# Patient Record
Sex: Female | Born: 1990 | Race: White | Hispanic: No | Marital: Single | Smoking: Never smoker
Health system: Southern US, Community
[De-identification: ages and names within clinical notes are randomized; demographics above are authoritative.]

## PROBLEM LIST (undated history)

## (undated) DIAGNOSIS — F329 Major depressive disorder, single episode, unspecified: Secondary | ICD-10-CM

## (undated) DIAGNOSIS — F32A Depression, unspecified: Secondary | ICD-10-CM

## (undated) DIAGNOSIS — G43909 Migraine, unspecified, not intractable, without status migrainosus: Secondary | ICD-10-CM

## (undated) DIAGNOSIS — F419 Anxiety disorder, unspecified: Secondary | ICD-10-CM

## (undated) HISTORY — PX: CHOLECYSTECTOMY: SHX55

---
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
## 2014-08-05 ENCOUNTER — Encounter: Payer: Self-pay | Admitting: Emergency Medicine

## 2014-08-05 ENCOUNTER — Emergency Department (INDEPENDENT_AMBULATORY_CARE_PROVIDER_SITE_OTHER): Payer: BLUE CROSS/BLUE SHIELD

## 2014-08-05 ENCOUNTER — Emergency Department
Admission: EM | Admit: 2014-08-05 | Discharge: 2014-08-05 | Disposition: A | Discharge status: Home / Self Care | Payer: BLUE CROSS/BLUE SHIELD | Source: Home / Self Care | Attending: Family Medicine | Admitting: Family Medicine

## 2014-08-05 DIAGNOSIS — M25572 Pain in left ankle and joints of left foot: Secondary | ICD-10-CM | POA: Diagnosis not present

## 2014-08-05 DIAGNOSIS — M7742 Metatarsalgia, left foot: Secondary | ICD-10-CM | POA: Diagnosis not present

## 2014-08-05 HISTORY — DX: Anxiety disorder, unspecified: F41.9

## 2014-08-05 HISTORY — DX: Major depressive disorder, single episode, unspecified: F32.9

## 2014-08-05 HISTORY — DX: Depression, unspecified: F32.A

## 2014-08-05 MED ORDER — MELOXICAM 15 MG PO TABS: 15.0000 mg | ORAL_TABLET | Freq: Every day | ORAL | Status: DC

## 2014-08-05 NOTE — ED Notes (Signed)
Reports feeling discomfort in arch of left foot last evening which worsened over night; did light activity in park yesterday.

## 2014-08-05 NOTE — ED Provider Notes (Signed)
CSN: [id number]     Arrival date & time 08/05/14  1242 History   First MD Initiated Contact with Patient 08/05/14 1315     Chief Complaint  Patient presents with  . Foot Pain      HPI Comments: Patient developed soreness in the arch of her left foot yesterday evening which became worse during the night.  No injury but she had been walking in a park yesterday.  Patient is a 24 y.o. female presenting with lower extremity pain. The history is provided by the patient.  Foot Pain This is a new problem. The current episode started yesterday. The problem occurs constantly. The problem has not changed since onset.Associated symptoms comments: none. The symptoms are aggravated by walking and standing. Nothing relieves the symptoms. She has tried nothing for the symptoms.    Past Medical History  Diagnosis Date  . Anxiety and depression    Past Surgical History  Procedure Laterality Date  . Cholecystectomy     Family History  Problem Relation Age of Onset  . Rheum arthritis Mother   . Cancer Father    History  Substance Use Topics  . Smoking status: Never Smoker   . Smokeless tobacco: Not on file  . Alcohol Use: Yes   OB History    No data available     Review of Systems  All other systems reviewed and are negative.   Allergies  Review of patient's allergies indicates not on file.  Home Medications   Prior to Admission medications   Medication Sig Start Date End Date Taking? Authorizing Provider  norethindrone-ethinyl estradiol (JUNEL FE,GILDESS FE,LOESTRIN FE) 1-20 MG-MCG tablet Take 1 tablet by mouth daily.   Yes Historical Provider, MD  meloxicam (MOBIC) 15 MG tablet Take 1 tablet (15 mg total) by mouth daily. Take with food each morning 08/05/14   Lattie Haw, MD   BP 100/72 mmHg  Pulse 96  Temp(Src) 98.6 F (37 C) (Oral)  Resp 16  Ht  (1.626 m)  Wt 215 lb (97.523 kg)  BMI 36.89 kg/m2  SpO2 98%  LMP 07/08/2014 Physical Exam  Constitutional: She is  oriented to person, place, and time. She appears well-developed and well-nourished. No distress.  Patient is obese (BMI 36.9)  HENT:  Head: Normocephalic.  Eyes: Pupils are equal, round, and reactive to light.  Musculoskeletal:       Right shoulder: She exhibits tenderness, bony tenderness and pain. She exhibits normal range of motion, no swelling, no crepitus, no deformity, no laceration and normal pulse.       Left foot: There is tenderness and bony tenderness. There is normal range of motion, no swelling, normal capillary refill, no crepitus, no deformity and no laceration.       Feet:  Left foot reveals distinct point tenderness over mid-shaft of second metatarsal.  No swelling or ecchymosis.  Neurological: She is alert and oriented to person, place, and time.  Skin: Skin is warm and dry. No rash noted.  Nursing note and vitals reviewed.   ED Course  Procedures  none  Imaging Review Dg Foot Complete Left  08/05/2014   CLINICAL DATA:  LEFT foot pain. Onset of symptoms last night. Tenderness to palpation over the second metatarsal.  EXAM: LEFT FOOT - COMPLETE 3+ VIEW  COMPARISON:  None.  FINDINGS: The alignment is within normal limits aside from splaying of the first and second toes, which may be normal for this patient. The Lisfranc joint appears normal. Midfoot  bones appear normal. No fracture is identified. Second metatarsal appears within normal limits. No radiopaque foreign body.  IMPRESSION: Negative.   Electronically Signed   By: Andreas NewportGeoffrey  Lamke M.D.   On: 08/05/2014 13:52     MDM   1. Metatarsalgia of left foot    Begin Mobic 15mg  daily.  Recommend wearing well fitting shoes with adequate arch support. Followup with Dr. Rodney Langtonhomas Thekkekand[facility] ([facility]) if not improving about two weeks.                                                                                    Lattie HawStephen A Beese, MD 08/08/14 [phone]

## 2014-08-05 NOTE — Discharge Instructions (Signed)
Recommend wearing well fitting shoes with adequate arch support.

## 2016-09-25 DIAGNOSIS — F330 Major depressive disorder, recurrent, mild: Secondary | ICD-10-CM | POA: Insufficient documentation

## 2017-08-15 ENCOUNTER — Ambulatory Visit (HOSPITAL_COMMUNITY): Payer: BLUE CROSS/BLUE SHIELD | Admitting: Psychiatry

## 2017-12-04 ENCOUNTER — Other Ambulatory Visit: Payer: Self-pay

## 2017-12-04 ENCOUNTER — Encounter (HOSPITAL_COMMUNITY): Payer: Self-pay | Admitting: Psychiatry

## 2017-12-04 ENCOUNTER — Ambulatory Visit (INDEPENDENT_AMBULATORY_CARE_PROVIDER_SITE_OTHER): Payer: PRIVATE HEALTH INSURANCE | Admitting: Psychiatry

## 2017-12-04 VITALS — BP 118/80 | HR 83 | Ht 64.0 in | Wt 208.0 lb

## 2017-12-04 DIAGNOSIS — F411 Generalized anxiety disorder: Secondary | ICD-10-CM | POA: Diagnosis not present

## 2017-12-04 DIAGNOSIS — F330 Major depressive disorder, recurrent, mild: Secondary | ICD-10-CM

## 2017-12-04 MED ORDER — ESCITALOPRAM OXALATE 10 MG PO TABS: 10.0000 mg | ORAL_TABLET | Freq: Every day | ORAL | 0 refills | Status: DC

## 2017-12-04 NOTE — Patient Instructions (Signed)
Refer to therapy  Assign a ME time  Assign a Worry time

## 2017-12-04 NOTE — Progress Notes (Signed)
Psychiatric Initial Adult Assessment   Patient Identification: Wendy Meyers MRN:  [id number] Date of Evaluation:  12/04/2017 Referral Source: Sherlyn Lees, MD Chief Complaint:   Chief Complaint    Establish Care; Anxiety     Visit Diagnosis:    ICD-10-CM   1. 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 GAD (generalized anxiety disorder) F41.1     History of Present Illness: 27 years old currently single white female referred by primary care physician for management of anxiety and depression  Patient is here is excessive about finances about her promotion about losing her friends these anxiety and fear keep her tense.  She has had friends in the past but they have left her.  She has difficulty maintaining sleep she has fear of losing or getting lonely She also has episodes of depression which may last for more than a week including decreased energy decreased sleep withdrawn loneliness feeling of sadness and crying spells that last for a week or so  Does not endorse paranoia or hearing voices psychotic symptoms or manic symptoms  Does not endorse regular alcohol use drinks every other day one wine last  Currently she is living with her mom it has been stressful because she still treats her like a 18 years  Patient has felt neglected by her dad who was favoring her sister.  She also had a difficult time in high school was bullied says that she was pale or she was quiet she was not active in sports she also had a difficult time in [place] undergrad school having dependent on friendship and then it would end and she would feel neglected  Modifying factor: family, house Aggravating factor: bullied when young, feels lonely  Duration: more then 10 years On and off counselling for depression when young    Associated Signs/Symptoms: Depression Symptoms:  depressed mood, fatigue, anxiety, loss of energy/fatigue, disturbed sleep, (Hypo) Manic Symptoms:   Distractibility, Anxiety Symptoms:  Excessive Worry, Psychotic Symptoms:  denies PTSD Symptoms: Had a traumatic exposure:  neglected by dad, bullied when young Hyperarousal:  Difficulty Concentrating Emotional Numbness/Detachment  Past Psychiatric History: depression, anxiety  Previous Psychotropic Medications: Yes   Substance Abuse History in the last 12 months:  Yes.    Consequences of Substance Abuse: Medical Consequences:  fatigue  Past Medical History:  Past Medical History:  Diagnosis Date  . Anxiety and depression     Past Surgical History:  Procedure Laterality Date  . CHOLECYSTECTOMY      Family Psychiatric History: sister : anxiety Mom possible depression  Family History:  Family History  Problem Relation Age of Onset  . Rheum arthritis Mother   . Cancer Father   . Anxiety disorder Sister     Social History:   Social History   Socioeconomic History  . Marital status: Single    Spouse name: Not on file  . Number of children: Not on file  . Years of education: Not on file  . Highest education level: Not on file  Occupational History  . Not on file  Social Needs  . Financial resource strain: Not on file  . Food insecurity:    Worry: Not on file    Inability: Not on file  . Transportation needs:    Medical: Not on file    Non-medical: Not on file  Tobacco Use  . Smoking status: Never Smoker  . Smokeless tobacco: Never Used  Substance and Sexual Activity  . Alcohol use: Yes  Alcohol/week: 1.0 standard drinks    Types: 1 Glasses of wine per week    Comment: 1 every few days  . Drug use: Not on file  . Sexual activity: Not on file  Lifestyle  . Physical activity:    Days per week: Not on file    Minutes per session: Not on file  . Stress: Not on file  Relationships  . Social connections:    Talks on phone: Not on file    Gets together: Not on file    Attends religious service: Not on file    Active member of club or organization: Not  on file    Attends meetings of clubs or organizations: Not on file    Relationship status: Not on file  Other Topics Concern  . Not on file  Social History Narrative  . Not on file    Additional Social History: grew up with parents till age 9.  They divorced after that.  Patient wanted them to divorce because dad was not favoring her and also she did not get along with him. Bullied in high school difficult time  Works as a Comptroller  Allergies:  No Known Allergies  Metabolic Disorder Labs: No results found for: HGBA1C, MPG No results found for: PROLACTIN No results found for: CHOL, TRIG, HDL, CHOLHDL, VLDL, LDLCALC   Current Medications: Current Outpatient Medications  Medication Sig Dispense Refill  . norethindrone-ethinyl estradiol (JUNEL FE,GILDESS FE,LOESTRIN FE) 1-20 MG-MCG tablet Take 1 tablet by mouth daily.    . SPRINTEC 28 0.25-35 MG-MCG tablet Take 1 tablet by mouth daily.  4  . escitalopram (LEXAPRO) 10 MG tablet Take 1 tablet (10 mg total) by mouth daily. 30 tablet 0  . meloxicam (MOBIC) 15 MG tablet Take 1 tablet (15 mg total) by mouth daily. Take with food each morning (Patient not taking: Reported on 12/04/2017) 15 tablet 0   No current facility-administered medications for this visit.     Neurologic: Headache: No Seizure: No Paresthesias:No  Musculoskeletal: Strength & Muscle Tone: within normal limits Gait & Station: normal Patient leans: no lean  Psychiatric Specialty Exam: Review of Systems  Skin: Negative for rash.  Neurological: Negative for tremors.  Psychiatric/Behavioral: Positive for depression. Negative for substance abuse. The patient is nervous/anxious.     Blood pressure 118/80, pulse 83, height 5\' 4"  (1.626 m), weight 208 lb (94.3 kg).Body mass index is 35.7 kg/m.  General Appearance: Casual  Eye Contact:  Fair  Speech:  Normal Rate  Volume:  Decreased  Mood:  Dysphoric  Affect:  Congruent  Thought Process:  Goal Directed   Orientation:  Full (Time, Place, and Person)  Thought Content:  Logical and Rumination  Suicidal Thoughts:  No  Homicidal Thoughts:  No  Memory:  Immediate;   Fair Recent;   Fair  Judgement:  Fair  Insight:  Shallow  Psychomotor Activity:  Decreased  Concentration:  Concentration: Fair and Attention Span: Fair  Recall:  [employer] of Knowledge:Good  Language: Good  Akathisia:  No  Handed:  Right  AIMS (if indicated):    Assets:  Desire for Improvement  ADL's:  Intact  Cognition: WNL  Sleep:  variable    Treatment Plan Summary: Medication management and Plan as follows  MDD, mild recurrent; start lexapro GAD: excessive worries. Refer to therapy for CBT  Start lexapro 5mg  increase if tolerate after one week  Assign a Worry time Assign a ME time and increase physical activitiy. Distraction from  worries  More than 50% time spent in counseling and coordination of care including patient education reviewed side effects and concerns were addressed FU 4 w or earlier if needed   [name], MD 10/31/20199:35 AM

## 2017-12-29 ENCOUNTER — Ambulatory Visit (INDEPENDENT_AMBULATORY_CARE_PROVIDER_SITE_OTHER): Payer: PRIVATE HEALTH INSURANCE | Admitting: Licensed Clinical Social Worker

## 2017-12-29 DIAGNOSIS — F411 Generalized anxiety disorder: Secondary | ICD-10-CM

## 2017-12-29 DIAGNOSIS — F330 Major depressive disorder, recurrent, mild: Secondary | ICD-10-CM | POA: Diagnosis not present

## 2017-12-30 ENCOUNTER — Encounter (HOSPITAL_COMMUNITY): Payer: Self-pay | Admitting: Licensed Clinical Social Worker

## 2017-12-30 NOTE — Progress Notes (Signed)
Comprehensive Clinical Assessment (CCA) Note  12/30/2017 Wendy BannisterJulie Meyers [id number]  Visit Diagnosis:      ICD-10-CM   1. GAD (generalized anxiety disorder) F41.1   2. 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  (See scanned document in Chart Review)  CCA Part Two A  Intake/Chief Complaint:  CCA Intake With Chief Complaint CCA Part Two Date: 12/29/17 CCA Part Two Time: 0840 Chief Complaint/Presenting Problem: Referred for therapy by our psychiatrist for concerns related to anxiety.   Patients Currently Reported Symptoms/Problems: Reports she often feels on edge.  Work is highly stressful and fast paced.  She lives with her mom and they have conflict at times because of differing views about things.  Has a fear of people not sticking around.   Worries excessively about her performance at work, finances, if friends are going to be there for her   Her anxiety gets in the way of her being able to enjoy life.  Denies having panic attacks, but often feels uneasy or nauseous.  Lacks focus.  Lacks motivation  Reports having a short fuse lately.       Individual's Strengths: Has a best friend from high school.  Has friends at work.  People say she is kind, has a good heart, is loyal, and supportive   Individual's Preferences: Wants to change her outlook on life.  Enjoys things.  Like herself.   Type of Services Patient Feels Are Needed: Therapy and medication management Initial Clinical Notes/Concerns: Reports she has had dificulties with anxiety and depression for years.  Has a history of cutting herself.  Felt like her feelings were not validated by her parents as she grew up.  First saw a therapist as a teenager.  Not particularly helpful at the time because she wasn't ready to talk about issues.  Started on Zoloft when she was in college.  Struggled with depression following the unexpected death of  her best friend in college.  The friend had been drinking and driving and veered off the road.  Last saw a therapist in 2017 for a couple of months      Mental Health Symptoms Depression:  Depression: Difficulty Concentrating, Fatigue, Sleep (too much or little), Worthlessness, Increase/decrease in appetite, Change in energy/activity, Hopelessness, Irritability  Mania:  Mania: N/A  Anxiety:   Anxiety: Fatigue, Difficulty concentrating, Worrying, Tension, Irritability  Psychosis:  Psychosis: N/A  Trauma:  Trauma: N/A  Obsessions:  Obsessions: N/A  Compulsions:  Compulsions: N/A  Inattention:  Inattention: N/A  Hyperactivity/Impulsivity:  Hyperactivity/Impulsivity: N/A  Oppositional/Defiant Behaviors:  Oppositional/Defiant Behaviors: N/A  Borderline Personality:  Emotional Irregularity: N/A  Other Mood/Personality Symptoms:      Mental Status Exam Appearance and self-care  Stature:  Stature: Average  Weight:  Weight: Overweight  Clothing:  Clothing: Casual  Grooming:  Grooming: Normal  Cosmetic use:  Cosmetic Use: None  Posture/gait:  Posture/Gait: Tense  Motor activity:  Motor Activity: Not Remarkable  Sensorium  Attention:  Attention: Normal  Concentration:  Concentration: Normal  Orientation:  Orientation: X5  Recall/memory:  Recall/Memory: Normal  Affect and Mood  Affect:  Affect: Anxious  Mood:  Mood: Anxious, Depressed  Relating  Eye contact:  Eye Contact: Normal  Facial expression:  Facial Expression: Constricted  Attitude toward examiner:  Attitude Toward Examiner: Cooperative  Thought and Language  Speech flow: Speech Flow: Normal  Thought content:  Preoccupation:     Hallucinations:     Organization:     Company secretary of Knowledge:  Fund of Knowledge: Average  Intelligence:  Intelligence: Average  Abstraction:  Abstraction: Normal  Judgement:  Judgement: Normal  Reality Testing:  Reality Testing: Adequate  Insight:  Insight: Fair  Decision  Making:  Decision Making: Normal  Social Functioning  Social Maturity:  Social Maturity: Isolates  Social Judgement:  Social Judgement: Victimized  Stress  Stressors:  Stressors: Work, Arts administrator, Family conflict  Coping Ability:  Coping Ability: Exhausted, Building surveyor Deficits:     Supports:      Family and Psychosocial History: Family history Marital status: Single(Hasn't had a long term boyfriend before) Does patient have children?: No  Childhood History:  Childhood History By whom was/is the patient raised?: Both parents Additional childhood history information: Grew up in [place].  Parents divorced when she was 75.  Moved to [place].  Description of patient's relationship with caregiver when they were a child: Mom was the one who was always there.  Worked full time as an Advertising account executive.    Dad wasn't around because he worked so much.  He was a Community education officer.  Growing up felt as though dad favored her older sister because he would attend most of her events but not patient's           Patient's description of current relationship with people who raised him/her: "We're close but she still treats me like I'm 18."  She can get mad at me and yell, but I'm not allowed to do that.       Doesn't like her dad.  Avoids talking to him.  "I made efforts and he was too busy."  She says he can't keep a job for very long.  Has a problem with gambling.  Asks older sister for $ fairly often and she will give it to him How were you disciplined when you got in trouble as a child/adolescent?: "I was a good kid."  No physical abuse Does patient have siblings?: Yes Number of Siblings: 1 Description of patient's current relationship with siblings: Older sister, [place] (30)- get along better now that they are older  There is a history of sister putting her down.   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, nutritional  Exercise/Diet: Exercise/Diet Do You Exercise?: Yes What Type of Exercise  Do You Do?: Run/Walk How Many Times a Week Do You Exercise?: 4-5 times a week Have You Gained or Lost A Significant Amount of Weight in the Past Six Months?: Yes-Gained Number of Pounds Gained: 20 Do You Follow a Special Diet?: No(Tends to eat junk food, trying to eat healthier) Do You Have Any Trouble Sleeping?: Yes Explanation of Sleeping Difficulties: Trouble staying asleep, but usually gets 6-7 hours  CCA Part Two C  Alcohol/Drug Use: Alcohol / Drug Use History of alcohol / drug use?: No history of alcohol / drug abuse                      CCA Part Three  ASAM's:  Six Dimensions of Multidimensional Assessment  Dimension 1:  Acute Intoxication and/or Withdrawal Potential:     Dimension 2:  Biomedical Conditions and Complications:     Dimension 3:  Emotional, Behavioral, or  Cognitive Conditions and Complications:     Dimension 4:  Readiness to Change:     Dimension 5:  Relapse, Continued use, or Continued Problem Potential:     Dimension 6:  Recovery/Living Environment:      Substance use Disorder (SUD)    Social Function:  Social Functioning Social Maturity: Isolates Social Judgement: Victimized  Stress:  Stress Stressors: Work, Arts administrator, Family conflict Coping Ability: Exhausted, Overwhelmed Patient Takes Medications The Way The Doctor Instructed?: Yes  Risk Assessment- Self-Harm Potential: Risk Assessment For Self-Harm Potential Thoughts of Self-Harm: No current thoughts Additional Information for Self-Harm Potential: Acts of Self-harm(Used to cut herself-last time 2018)  Risk Assessment -Dangerous to Others Potential: Risk Assessment For Dangerous to Others Potential Method: No Plan Additional Comments for Danger to Others Potential: Denies history of harm to others  DSM5 Diagnoses: Patient Active Problem List   Diagnosis Date Noted  . Mild episode of recurrent major depressive disorder (HCC) 09/25/2016      Recommendations for Services/Supports/Treatments: Recommendations for Services/Supports/Treatments Recommendations For Services/Supports/Treatments: Individual Therapy, Medication Management  Treatment Plan Summary: OP Treatment Plan Summary: Wants to develop a more positive outlook on life, enjoy things more, like herself.    Marilu Favre

## 2018-01-03 ENCOUNTER — Other Ambulatory Visit (HOSPITAL_COMMUNITY): Payer: Self-pay | Admitting: Psychiatry

## 2018-01-06 ENCOUNTER — Other Ambulatory Visit (HOSPITAL_COMMUNITY): Payer: Self-pay

## 2018-01-06 MED ORDER — ESCITALOPRAM OXALATE 10 MG PO TABS: 10.0000 mg | ORAL_TABLET | Freq: Every day | ORAL | 0 refills | Status: DC

## 2018-01-08 ENCOUNTER — Ambulatory Visit (HOSPITAL_COMMUNITY): Payer: PRIVATE HEALTH INSURANCE | Admitting: Psychiatry

## 2018-01-12 ENCOUNTER — Ambulatory Visit (HOSPITAL_COMMUNITY): Payer: PRIVATE HEALTH INSURANCE | Admitting: Licensed Clinical Social Worker

## 2018-01-13 ENCOUNTER — Encounter (HOSPITAL_COMMUNITY): Payer: Self-pay | Admitting: Psychiatry

## 2018-01-13 ENCOUNTER — Other Ambulatory Visit: Payer: Self-pay

## 2018-01-13 ENCOUNTER — Ambulatory Visit (INDEPENDENT_AMBULATORY_CARE_PROVIDER_SITE_OTHER): Payer: PRIVATE HEALTH INSURANCE | Admitting: Psychiatry

## 2018-01-13 VITALS — BP 114/76 | HR 73 | Ht 64.0 in | Wt 210.0 lb

## 2018-01-13 DIAGNOSIS — F411 Generalized anxiety disorder: Secondary | ICD-10-CM | POA: Diagnosis not present

## 2018-01-13 DIAGNOSIS — F330 Major depressive disorder, recurrent, mild: Secondary | ICD-10-CM | POA: Diagnosis not present

## 2018-01-13 MED ORDER — ESCITALOPRAM OXALATE 10 MG PO TABS: 10.0000 mg | ORAL_TABLET | Freq: Every day | ORAL | 1 refills | Status: DC

## 2018-01-13 NOTE — Progress Notes (Signed)
[facility] Outpatient Follow up visit   Patient Identification: Wendy Meyers MRN:  [id number] Date of Evaluation:  01/13/2018 Referral Source: Sherlyn Lees, MD Chief Complaint:   Chief Complaint    Follow-up; Other     Visit Diagnosis:    ICD-10-CM   1. 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 GAD (generalized anxiety disorder) F41.1     History of Present Illness: 27 years old currently single white femaleinitially referred by primary care physician for management of anxiety and depression  Patient is here is excessive about finances about her promotion about losing her friends. has fear of losing or getting lonely Currently she is living with her mom it has been stressful because she still treats her like a 16 years  Patient has felt neglected by her dad who was favoring her sister.   Last visit lexapro was started. It has helped, initailly had nausea with it. Now at 10mg . 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    Consequences of Substance Abuse: Medical Consequences:  fatigue  Past Medical History:  Past Medical History:  Diagnosis Date  . Anxiety and depression     Past Surgical History:  Procedure Laterality Date  . CHOLECYSTECTOMY      Family Psychiatric History: sister : anxiety Mom possible depression  Family History:  Family History  Problem Relation Age of Onset  . Rheum arthritis Mother   . Cancer Father   . Anxiety disorder Sister     Social History:   Social History   Socioeconomic History  . Marital status: Single    Spouse name: Not on file  . Number of children:  Not on file  . Years of education: Not on file  . Highest education level: Not on file  Occupational History  . Not on file  Social Needs  . Financial resource strain: Not on file  . Food insecurity:    Worry: Not on file    Inability: Not on file  . Transportation needs:    Medical: Not on file    Non-medical: Not on file  Tobacco Use  . Smoking status: Never Smoker  . Smokeless tobacco: Never Used  Substance and Sexual Activity  . Alcohol use: Yes    Alcohol/week: 1.0 standard drinks    Types: 1 Glasses of wine per week    Comment: 1 every few days  . Drug use: Not on file  . Sexual activity: Not on file  Lifestyle  . Physical activity:    Days per week: Not on file    Minutes per session: Not on file  . Stress: Not on file  Relationships  . Social connections:    Talks on phone: Not on file    Gets together: Not on file    Attends religious service: Not on file    Active member of club or organization: Not on file    Attends meetings of clubs or organizations: Not on file    Relationship status: Not on file  Other Topics Concern  . Not on file  Social History Narrative  . Not on file     Works as a  audit officer  Allergies:  No Known Allergies  Metabolic Disorder Labs: No results found for: HGBA1C, MPG No results found for: PROLACTIN No results found for: CHOL, TRIG, HDL, CHOLHDL, VLDL, LDLCALC   Current Medications: Current Outpatient Medications  Medication Sig Dispense Refill  . norethindrone-ethinyl estradiol (JUNEL FE,GILDESS FE,LOESTRIN FE) 1-20 MG-MCG tablet Take 1 tablet by mouth daily.    . SPRINTEC 28 0.25-35 MG-MCG tablet Take 1 tablet by mouth daily.  4  . escitalopram (LEXAPRO) 10 MG tablet Take 1 tablet (10 mg total) by mouth daily. 30 tablet 1  . meloxicam (MOBIC) 15 MG tablet Take 1 tablet (15 mg total) by mouth daily. Take with food each morning (Patient not taking: Reported on 12/04/2017) 15 tablet 0   No current facility-administered  medications for this visit.       Psychiatric Specialty Exam: Review of Systems  Cardiovascular: Negative for chest pain.  Skin: Negative for rash.  Neurological: Negative for tremors.  Psychiatric/Behavioral: Negative for substance abuse.    Blood pressure 114/76, pulse 73, height 5\' 4"  (1.626 m), weight 210 lb (95.3 kg).Body mass index is 36.05 kg/m.  General Appearance: Casual  Eye Contact:  Fair  Speech:  Normal Rate  Volume:  Decreased  Mood:  subdued  Affect:  Congruent  Thought Process:  Goal Directed  Orientation:  Full (Time, Place, and Person)  Thought Content:  Logical and Rumination  Suicidal Thoughts:  No  Homicidal Thoughts:  No  Memory:  Immediate;   Fair Recent;   Fair  Judgement:  Fair  Insight:  Shallow  Psychomotor Activity:  Decreased  Concentration:  Concentration: Fair and Attention Span: Fair  Recall:  [employer]Fair  Fund of Knowledge:Good  Language: Good  Akathisia:  No  Handed:  Right  AIMS (if indicated):    Assets:  Desire for Improvement  ADL's:  Intact  Cognition: WNL  Sleep:  variable    Treatment Plan Summary: Medication management and Plan as follows  MDD, mild recurrent; some better. Continue lexapro GAD: still there, not worse. Feels med has helped some, continue 10mg  lexapro  Continue to work on limiting worry time and coping skill therapy  Fu 6w. If needed may increase lexapro next visit. She does not want to increase as of now   Thresa RossNadeem Yardley Lekas, MD 12/10/20194:23 PM

## 2018-02-02 ENCOUNTER — Ambulatory Visit (HOSPITAL_COMMUNITY): Payer: PRIVATE HEALTH INSURANCE | Admitting: Licensed Clinical Social Worker

## 2018-02-02 DIAGNOSIS — F411 Generalized anxiety disorder: Secondary | ICD-10-CM | POA: Diagnosis not present

## 2018-02-02 DIAGNOSIS — F330 Major depressive disorder, recurrent, mild: Secondary | ICD-10-CM

## 2018-02-02 NOTE — Progress Notes (Signed)
   THERAPIST PROGRESS NOTE  Session Time: 9:40am-10:30am  Participation Level: Active  Behavioral Response: CasualAlertDysphoric and Irritable  Type of Therapy: Individual Therapy  Treatment Goals addressed:  Increase energy and enjoyment of activities  Be able to talk about herself in positive terms Reduce worry   Interventions: Assessment  Suicidal/Homicidal: Denied both  Therapist Interventions: Gathered information about significant events and changes in mood and functioning since last seen at her initial [facility] about a month ago. Explored patient's ideas about what it means to be successful at work. Learned more about the nature of patient's relationship with her mom.  Discussed how her mom's dependence on her contributes to her unhappiness.     Summary: Reported being very busy at work in the past month.  She is adjusting to new responsibilities after being promoted.  Talked about having a fear of failure.  Defines success at work as being able to maintain her job status and positive reputation and complete projects without having to enlist extra help.   Expects the next several months to be very busy as well.  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  Hasn't had adequate time to study. Was able to take off from work for the past couple weeks.  Hasn't had the energy to do much of anything.  Described how life at home is stressful in a different way.  Described her mom as someone who has a tendency to be dramatic and her mood can change quickly.  Also described how mom relies on her emotionally.  Says she wants to move out on her own but she can't afford it at this time.  Worries about how mom will survive without her help but believes it is important to establish a life separate from her mom.       Plan: Scheduled to return 02/26/18.   Treatment plan review is due 03/31/18  Diagnosis: GAD       MDD recurrent, mild    Darrin LuisSolomon, Sarah A, LCSW 02/02/2018

## 2018-02-26 ENCOUNTER — Ambulatory Visit (INDEPENDENT_AMBULATORY_CARE_PROVIDER_SITE_OTHER): Payer: PRIVATE HEALTH INSURANCE | Admitting: Licensed Clinical Social Worker

## 2018-02-26 DIAGNOSIS — F411 Generalized anxiety disorder: Secondary | ICD-10-CM

## 2018-02-26 DIAGNOSIS — F330 Major depressive disorder, recurrent, mild: Secondary | ICD-10-CM

## 2018-02-27 NOTE — Progress Notes (Signed)
   THERAPIST PROGRESS NOTE  Session Time: 4:13pm-5:04pm  Participation Level: Active  Behavioral Response: Casual  Alert   Agitated  Type of Therapy: Individual Therapy  Treatment Goals addressed:  Increase energy and enjoyment of activities  Be able to talk about herself in positive terms Reduce worry   Interventions: Encouraging honest expression of thoughts and feelings  Suicidal/Homicidal: Denied both  Therapist Interventions: Explored the nature of patient's relationship with a friend from work.  Encouraged patient to express to him how his behavior makes her feel.  Suggested writing an e-mail/letter as a medium to express herself most effectively.  Discussed how not expressing how you feel causes a build up in anger and resentment.    Summary: Reported having a stressful week.  She has been distraught over the fact that her friend from work asked that they only talk to one another when they are at work.  Her friend is married and his wife is not comfortable with him having a close friendship with a single female.  Patient insists she is not attracted to her friend in a sexual way.  Feels uncomfortable with the fact her friend has been secretive with his wife about their relationship.   Indicated she is hesitant to be truthful with her friend about how she feels.  Afraid of losing the friendship. Reported staying in bed all day on Sunday because she was so distressed.  Continues to worry excessively and think about herself in negative terms.   Plan:  Planning to schedule a return appointment in about two weeks. Treatment plan review is due 03/31/18  Diagnosis: GAD                          MDD recurrent, mild    Darrin Luis 02/27/2018

## 2018-03-10 ENCOUNTER — Ambulatory Visit (HOSPITAL_COMMUNITY): Payer: PRIVATE HEALTH INSURANCE | Admitting: Psychiatry

## 2018-04-09 ENCOUNTER — Ambulatory Visit (HOSPITAL_COMMUNITY): Payer: PRIVATE HEALTH INSURANCE | Admitting: Licensed Clinical Social Worker

## 2018-04-30 ENCOUNTER — Ambulatory Visit (HOSPITAL_COMMUNITY): Payer: PRIVATE HEALTH INSURANCE | Admitting: Psychiatry

## 2018-05-08 ENCOUNTER — Other Ambulatory Visit (HOSPITAL_COMMUNITY): Payer: Self-pay | Admitting: Psychiatry

## 2018-05-11 ENCOUNTER — Ambulatory Visit (HOSPITAL_COMMUNITY): Payer: PRIVATE HEALTH INSURANCE | Admitting: Psychiatry

## 2018-05-14 ENCOUNTER — Ambulatory Visit (INDEPENDENT_AMBULATORY_CARE_PROVIDER_SITE_OTHER): Payer: PRIVATE HEALTH INSURANCE | Admitting: Psychiatry

## 2018-05-14 ENCOUNTER — Encounter (HOSPITAL_COMMUNITY): Payer: Self-pay | Admitting: Psychiatry

## 2018-05-14 DIAGNOSIS — F411 Generalized anxiety disorder: Secondary | ICD-10-CM | POA: Diagnosis not present

## 2018-05-14 DIAGNOSIS — F330 Major depressive disorder, recurrent, mild: Secondary | ICD-10-CM | POA: Diagnosis not present

## 2018-05-14 MED ORDER — ESCITALOPRAM OXALATE 10 MG PO TABS: 10.0000 mg | ORAL_TABLET | Freq: Every day | ORAL | 0 refills | Status: DC

## 2018-05-14 NOTE — Progress Notes (Signed)
[facility] Outpatient Follow up visit   Patient Identification: Wendy Meyers MRN:  [id number] Date of Evaluation:  05/14/2018 Referral Source: Sherlyn Lees, MD Chief Complaint:    Visit Diagnosis:    ICD-10-CM   1. 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 GAD (generalized anxiety disorder) F41.1     History of Present Illness: 28 years old currently single white femaleinitially referred by primary care physician for management of anxiety and depression  I connected with Rhett Bannister on 05/14/18 at  4:00 PM EDT by telephone and verified that I am speaking with the correct person using two identifiers.   I discussed the limitations, risks, security and privacy concerns of performing an evaluation and management service by telephone and the availability of in person appointments. I also discussed with the patient that there may be a patient responsible charge related to this service. The patient expressed understanding and agreed to proceed  Doing fair some anxiety working from home and had concern of loosing friends after promotion  Patient has felt neglected by her dad who was favoring her sister.   Tolerating lexapro Modifying factor: family, house Aggravating factor:bullied when young Duration: more then 10 years On and off counselling for depression when young   Past Psychiatric History: depression, anxiety  Previous Psychotropic Medications: Yes   Substance Abuse History in the last 12 months:  Yes.    Consequences of Substance Abuse: Medical Consequences:  fatigue  Past Medical History:  Past Medical History:  Diagnosis Date  . Anxiety and depression     Past Surgical History:  Procedure Laterality Date  . CHOLECYSTECTOMY      Family Psychiatric History: sister : anxiety Mom possible depression  Family History:  Family History  Problem Relation Age of Onset  . Rheum arthritis Mother   . Cancer Father   . Anxiety disorder Sister      Social History:   Social History   Socioeconomic History  . Marital status: Single    Spouse name: Not on file  . Number of children: Not on file  . Years of education: Not on file  . Highest education level: Not on file  Occupational History  . Not on file  Social Needs  . Financial resource strain: Not on file  . Food insecurity:    Worry: Not on file    Inability: Not on file  . Transportation needs:    Medical: Not on file    Non-medical: Not on file  Tobacco Use  . Smoking status: Never Smoker  . Smokeless tobacco: Never Used  Substance and Sexual Activity  . Alcohol use: Yes    Alcohol/week: 1.0 standard drinks    Types: 1 Glasses of wine per week    Comment: 1 every few days  . Drug use: Not on file  . Sexual activity: Not on file  Lifestyle  . Physical activity:    Days per week: Not on file    Minutes per session: Not on file  . Stress: Not on file  Relationships  . Social connections:    Talks on phone: Not on file    Gets together: Not on file    Attends religious service: Not on file    Active member of club or organization: Not on file    Attends meetings of clubs or organizations: Not on file    Relationship status: Not on file  Other Topics Concern  . Not on file  Social History  Narrative  . Not on file     Works as a Comptrolleraudit officer  Allergies:  No Known Allergies  Metabolic Disorder Labs: No results found for: HGBA1C, MPG No results found for: PROLACTIN No results found for: CHOL, TRIG, HDL, CHOLHDL, VLDL, LDLCALC   Current Medications: Current Outpatient Medications  Medication Sig Dispense Refill  . escitalopram (LEXAPRO) 10 MG tablet Take 1 tablet (10 mg total) by mouth daily. 30 tablet 0  . meloxicam (MOBIC) 15 MG tablet Take 1 tablet (15 mg total) by mouth daily. Take with food each morning (Patient not taking: Reported on 12/04/2017) 15 tablet 0  . norethindrone-ethinyl estradiol (JUNEL FE,GILDESS FE,LOESTRIN FE) 1-20 MG-MCG  tablet Take 1 tablet by mouth daily.    . SPRINTEC 28 0.25-35 MG-MCG tablet Take 1 tablet by mouth daily.  4   No current facility-administered medications for this visit.       Psychiatric Specialty Exam: Review of Systems  Cardiovascular: Negative for chest pain.  Skin: Negative for rash.  Neurological: Negative for tremors.  Psychiatric/Behavioral: Negative for depression and substance abuse.    There were no vitals taken for this visit.There is no height or weight on file to calculate BMI.  General Appearance:   Eye Contact:  Speech:  Normal Rate  Volume:  Decreased  Mood: fair   Affect:  Congruent  Thought Process:  Goal Directed  Orientation:  Full (Time, Place, and Person)  Thought Content:  Logical and Rumination  Suicidal Thoughts:  No  Homicidal Thoughts:  No  Memory:  Immediate;   Fair Recent;   Fair  Judgement:  Fair  Insight:  Shallow  Psychomotor Activity:  Decreased  Concentration:  Concentration: Fair and Attention Span: Fair  Recall:  [employer]Fair  Fund of Knowledge:Good  Language: Good  Akathisia:  No  Handed:  Right  AIMS (if indicated):    Assets:  Desire for Improvement  ADL's:  Intact  Cognition: WNL  Sleep:  variable    Treatment Plan Summary: Medication management and Plan as follows  MDD, mild recurrent; doing fair. Continue lexapro GAD: still there, not worse. Feels med has helped some, continue 10mg  lexapro  Continue to work on limiting worry time and coping skill therapy  I discussed the assessment and treatment plan with the patient. The patient was provided an opportunity to ask questions and all were answered. The patient agreed with the plan and demonstrated an understanding of the instructions.   The patient was advised to call back or seek an in-person evaluation if the symptoms worsen or if the condition fails to improve as anticipated.  I provided 15 minutes of non-face-to-face time during this encounter. Fu [id number]. Renewed  meds   Thresa RossNadeem Cyani Kallstrom, MD 4/9/20204:05 PM

## 2018-05-18 ENCOUNTER — Ambulatory Visit (HOSPITAL_COMMUNITY): Payer: PRIVATE HEALTH INSURANCE | Admitting: Licensed Clinical Social Worker

## 2018-05-27 ENCOUNTER — Ambulatory Visit (INDEPENDENT_AMBULATORY_CARE_PROVIDER_SITE_OTHER): Payer: PRIVATE HEALTH INSURANCE | Admitting: Licensed Clinical Social Worker

## 2018-05-27 DIAGNOSIS — F330 Major depressive disorder, recurrent, mild: Secondary | ICD-10-CM | POA: Diagnosis not present

## 2018-05-27 DIAGNOSIS — F411 Generalized anxiety disorder: Secondary | ICD-10-CM

## 2018-05-27 NOTE — Progress Notes (Signed)
Virtual Visit via Telephone Note  I connected with Wendy BannisterJulie Meyers on 05/27/18 at  3:00 PM EDT by telephone and verified that I am speaking with the correct person using two identifiers.   I discussed the limitations, risks, security and privacy concerns of performing an evaluation and management service by telephone and the availability of in person appointments. I also discussed with the patient that there may be a patient responsible charge related to this service. The patient expressed understanding and agreed to proceed.  Follow Up Instructions:    I discussed the assessment and treatment plan with the patient. The patient was provided an opportunity to ask questions and all were answered. The patient agreed with the plan and demonstrated an understanding of the instructions.   The patient was advised to call back or seek an in-person evaluation if the symptoms worsen or if the condition fails to improve as anticipated.  I provided 55 minutes of non-face-to-face time during this encounter.   Coolidge BreezeMary Adamarys Shall, LCSW   THERAPIST PROGRESS NOTE  Session Time: 3:02 PM to 3:57 PM Participation Level: Active  Behavioral Response: CasualAlertappropriate  Type of Therapy: Individual Therapy  Treatment Goals addressed:  Increase energy and enjoyment of activities  Be able to talk about herself in positive terms Reduce worry  Interventions: CBT, Solution Focused, Strength-based, Supportive, Reframing and Other: coping  Summary: Wendy Meyers is a 28 y.o. female who presents with "I've been better" working from home since mid March, doesn't focus as well. Worried about being furloughed, can't go out and see anybody   Mom asked her to do things, doesn't intend to bother her but can, not having the structure, dog can be a distraction.  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 Reviewed what she does to enjoy herself and  she likes watching movies on TV that helps her to relax.  With last therapist working on patient enjoying life because haven't enjoyed life for a long time. Don't see things in a positive way. Bad things happened, people have left, and thinks going to alone and living by day. Big part of this perspective comes from growing up and had a lot of "daddy' issues". Father would always put her sister first, anything she did was the most important thing and for patient that did not happen. Discussed her father not having basic parenting skills and can say not a good dad. Mom explains that it was like this because compared to sister she was too much work. Sister go with flow, patient has more of a  mind of her own, nothing can make her do something if she doesn't want to do it. Mom tells her that she chooses to hang on to the past but she doesn't know what it is like because didn't have a bad dad. Mom defends him and could have done worse.  This is a narrative she struggles with. Dad doesn't acknowledge it happened and tells her to get over it. As older better relationship with sister. Dad she hasn't seen in awhile, does not want to reach out to him right now  Explains with previous therapist that worked in more detail of making a list of what makes her happy and doesn't. She would like to find something that does. Agrees broadening range of activities would be a good idea, also she agrees that she would like to have a little more social activities. She would  like to find something.  Reviewed session and patient said that it gives her another chance that she can get better.  Suicidal/Homicidal: No  Therapist Response: Therapist assessed patient current functioning per report and used session to review current symptoms, gather information about significant events and any changes in functioning as this is first session with patient.  Reviewed goals patient was working on with previous therapist and progress with goals.    Discussed how her past has been a part of development of negative perspective.  Reviewed experiences with dad that is significant for self perspective.  Work with patient on processing feelings as this is a coping strategy to work through toward healing.  Explained that we all developed narratives based on childhood and past experiences, they are subjective and we bring this narrative to her current life situations.  Helpful to recognize this narrative because may not be helpful and not relevant to current life situations.  Often cant identify narrative when tied to specific circumstances from the past helps [place] to see that it is tied to the past and not the present. First recognize that the past is not the present and no longer applies to present circumstances. Also challenge narrative for accuracy and helpfulness and replace with something more helpful.  Reviewed strategy for anxiety where patient asked herself what am I worrying about?  Is this a problem I can do something about?  If no, let the worry go and focus on something else that is important to her right now.  If yes, work out what she can do, list her options.  Then, is there anything I can do right now?  If yes do it now and then let go of worry and focus on something else that is important to her right now.  Explained that problem solving can be helpful when it works toward a solution but unhelpful when it becomes worry and anxiety. Explained that anxiety is thinking ahead and predicting the worst case scenario.  Discussed letting go of worry when it no longer is helpful toward solving problem and consider it is directed toward future event that has not happened, that does not exist so at some point without purpose.   Discussed patient not getting good parenting growing up and now having to learn to be a good parent to herself and help herself in her own healing and that she does have resources to heal and be self-nurturing. Discussed empowering  way to see that she can create the life she wants for herself.   Plan: Return again in 3 weeks.(Patient is in busy season right now from work and will be seen more often once she is less busy) 2.  Therapist work with patient on changing her narrative, mood regulation, coping  Diagnosis: Axis I:  GAD                          MDD recurrent, mild    Axis II: No diagnosis    Coolidge Breeze, LCSW 05/27/2018

## 2018-06-17 ENCOUNTER — Ambulatory Visit (HOSPITAL_COMMUNITY): Payer: PRIVATE HEALTH INSURANCE | Admitting: Licensed Clinical Social Worker

## 2018-07-06 ENCOUNTER — Other Ambulatory Visit (HOSPITAL_COMMUNITY): Payer: Self-pay | Admitting: Psychiatry

## 2018-07-08 ENCOUNTER — Ambulatory Visit (INDEPENDENT_AMBULATORY_CARE_PROVIDER_SITE_OTHER): Payer: PRIVATE HEALTH INSURANCE | Admitting: Licensed Clinical Social Worker

## 2018-07-08 DIAGNOSIS — F330 Major depressive disorder, recurrent, mild: Secondary | ICD-10-CM | POA: Diagnosis not present

## 2018-07-08 DIAGNOSIS — F411 Generalized anxiety disorder: Secondary | ICD-10-CM | POA: Diagnosis not present

## 2018-07-08 NOTE — Progress Notes (Signed)
Virtual Visit via Telephone Note  I connected with Wendy Meyers on 07/08/18 at  4:00 PM EDT by telephone and verified that I am speaking with the correct person using two identifiers.   I discussed the limitations, risks, security and privacy concerns of performing an evaluation and management service by telephone and the availability of in person appointments. I also discussed with the patient that there may be a patient responsible charge related to this service. The patient expressed understanding and agreed to proceed.  THERAPIST PROGRESS NOTE  Session Time: 4:03 PM to 4:56 PM  Participation Level: Active  Behavioral Response: CasualAlertappropriate  Type of Therapy: Individual Therapy  Treatment Goals addressed: Increase energy and enjoyment of activities  Be able to talk about herself in positive terms Reduce worry Interventions: CBT, Solution Focused, Strength-based, Supportive and Other: coping  Summary: Wendy Meyers is a 28 y.o. female who presents with busy season over, a bunch of people at work were furloughed and I wasn't and working from home. Doing things from home is the new normal which I can't stand. Disconnect from other people and there is always a distractions.   It has been hard because I am home and I can't do anything. I have gone out a couple times when released restrictions but hard. There is no change and all melts together.  Reviewed ways to occupy time and relates that stationary bike ordered. Picked up on video gaming because it keeps me busy, trying to read more again. Lately haven't been in that great of a mood. My Best friend Wendy Meyers me everyday and he was furloughed  and weird not talking to him everyday. It is complicated because he is married and I get along with his wife but she doesn't like that we talk outside of work. Talked everyday for three years. We have similar backgrounds and interests and why we click. I care when he is upset and I put myself on  back burner, when in a mood he can hurt me emotionally and draining but miss him very much. I am more supportive of him now. He was there when I was in a bad place. It is hard for me to let people in. He supports me when I need it. He knows me and Skypes me and knows when I am not ok. I go out of the way to show I care for him, but it is not taken the right way. He used to me being a good friend to him. He thinks that he has been a good friend, he doesn't have any idea how he has hurt me.  Discussed developing other supports and shares that I have other friends, I can talk to him about things I can't talk to other people about. Maybe it is about not having attention from dad. A Smart person recognizes that he  isn't the best friend and be done, not ok that he treats me the way does. I am afraid to tell him because afraid he will leave and this relationship is important to me.   I have always been that way. I have been in a place where friends have left for no reason. I have been left more that I want to admit. I could do better about the whole people come and go thing. It is better to have somebody than to be alone. I have spoken up and argued before (in other relationships) and that hasn't worked. I could speak up and argue and he would  think I am crazy and over invested, and I am but not in the way he thinks. He is not as emotional as me that makes it hard but there is something calming in the relationship. Shared fear that he helps me at work and I might feel like I will fail without his help.  Reviewed expanding social network and patient shares thate has options but coronavirus has messed a lot of things.  Reviewed patient tendency to negative forecast to address in treatment. Says work is still a worry because more people could get furloughed.   Suicidal/Homicidal: No  Therapist Response: Assess patient current functioning per report and processed feelings related to current stressors.  Discussed  strategies to help with feeling isolated and cut off from an important source of support.  Reviewed some ideas about activities to stay occupied, reframe experience as a time that can be enriching and put energy into activities that are rewarding for her.  Discussed certain activities that will help improve mood such as exercise and being outside.  Reviewed that engaging in intellectual activities such as reading helps with mood. Encourage patient and broadening network of support and how this is particularly helpful when some supports are not available.  Bided strength based intervention and discussing positive step patient talk and ordering bike that will help her with activities, and also help with mood.  Reviewed feelings related to significant relationship and identified how it seems similar to pattern with her father, and importance of identifying unhealthy patterns and reworking relationship patterns to more healthy ones.  Reviewed pros and cons of this relationship and identify patient finds value in the relationship that makes her decide to stay with that.  Encouraged patient in relationships where can speak up and not afraid that she will lose the person as well as getting her needs met.  Discussed importance of getting her needs met for mood and functioning.  Reframe patient working on her own as a opportunity to recognize her own capabilities and also chance to grow her skills.   Provided strength based and supportive intervention.   Challenged patient on negative forecasting that it is projections in the future that have not happened yet so events that are not real.  Questioned investing energy into something that has not happened yet and more helpful to stay present focused Plan: Return again in 2 weeks.2.  Therapist introduced relaxation exercises and mindfulness 3.  Therapist continue to work with patient on strategies to decrease anxiety and depression  Diagnosis: Axis I: GAD                           MDD recurrent, mild    Axis II: No diagnosis   Follow Up Instructions:    I discussed the assessment and treatment plan with the patient. The patient was provided an opportunity to ask questions and all were answered. The patient agreed with the plan and demonstrated an understanding of the instructions.   The patient was advised to call back or seek an in-person evaluation if the symptoms worsen or if the condition fails to improve as anticipated.  I provided 53 minutes of non-face-to-face time during this encounter.   Cordella Register, LCSW 07/08/2018

## 2018-07-09 ENCOUNTER — Other Ambulatory Visit (HOSPITAL_COMMUNITY): Payer: Self-pay | Admitting: Psychiatry

## 2018-07-09 NOTE — Telephone Encounter (Signed)
Informed patient that medication was sent to the pharmacy on 07/06/18

## 2018-07-09 NOTE — Telephone Encounter (Signed)
Pt needs refill on lexapro [facility] in [place]

## 2018-07-17 ENCOUNTER — Ambulatory Visit (INDEPENDENT_AMBULATORY_CARE_PROVIDER_SITE_OTHER): Payer: PRIVATE HEALTH INSURANCE | Admitting: Psychiatry

## 2018-07-17 ENCOUNTER — Encounter (HOSPITAL_COMMUNITY): Payer: Self-pay | Admitting: Psychiatry

## 2018-07-17 DIAGNOSIS — F330 Major depressive disorder, recurrent, mild: Secondary | ICD-10-CM | POA: Diagnosis not present

## 2018-07-17 DIAGNOSIS — F411 Generalized anxiety disorder: Secondary | ICD-10-CM | POA: Diagnosis not present

## 2018-07-17 MED ORDER — ESCITALOPRAM OXALATE 10 MG PO TABS: 10.0000 mg | ORAL_TABLET | Freq: Every day | ORAL | 1 refills | Status: DC

## 2018-07-17 NOTE — Progress Notes (Signed)
[facility]   Patient Identification: Wendy BannisterJulie Herdt MRN:  [id number] Date of Evaluation:  07/17/2018 Referral Source: Sherlyn LeesErin Judje, MD Chief Complaint:   depression follow up Visit Diagnosis:    ICD-10-CM   1. Major depressive disorder, recurrent episode, mild degree (HCC)  F33.0   2. GAD (generalized anxiety disorder)  F41.1     History of Present Illness: 28  years old currently single white femaleinitially referred by primary care physician for management of anxiety and depression  I connected with Wendy BannisterJulie Heiner on 07/17/18 at 10:30 AM EDT by telephone and verified that I am speaking with the correct person using two identifiers.   I discussed the limitations, risks, security and privacy concerns of performing an evaluation and management service by telephone and the availability of in person appointments. I also discussed with the patient that there may be a patient responsible charge related to this service. The patient expressed understanding and agreed to proceed.  Doing fair. Some concern of working away from team since promotion and losing them as friends Mom is supportive Felt  bullied when young  Tolerating lexapro Modifying factor: family, house Aggravating factor:bullied when young Duration: more then 10 years On and off counselling for depression when young   Past Psychiatric History: depression, anxiety  Previous Psychotropic Medications: Yes   Substance Abuse History in the last 12 months:  Yes.    Consequences of Substance Abuse: Medical Consequences:  fatigue  Past Medical History:  Past Medical History:  Diagnosis Date  . Anxiety and depression     Past Surgical History:  Procedure Laterality Date  . CHOLECYSTECTOMY      Family Psychiatric History: sister : anxiety Mom possible depression  Family History:  Family History  Problem Relation Age of Onset  . Rheum arthritis Mother   . Cancer Father   . Anxiety disorder Sister      Social History:   Social History   Socioeconomic History  . Marital status: Single    Spouse name: Not on file  . Number of children: Not on file  . Years of education: Not on file  . Highest education level: Not on file  Occupational History  . Not on file  Social Needs  . Financial resource strain: Not on file  . Food insecurity    Worry: Not on file    Inability: Not on file  . Transportation needs    Medical: Not on file    Non-medical: Not on file  Tobacco Use  . Smoking status: Never Smoker  . Smokeless tobacco: Never Used  Substance and Sexual Activity  . Alcohol use: Yes    Alcohol/week: 1.0 standard drinks    Types: 1 Glasses of wine per week    Comment: 1 every few days  . Drug use: Not on file  . Sexual activity: Not on file  Lifestyle  . Physical activity    Days per week: Not on file    Minutes per session: Not on file  . Stress: Not on file  Relationships  . Social Musicianconnections    Talks on phone: Not on file    Gets together: Not on file    Attends religious service: Not on file    Active member of club or organization: Not on file    Attends meetings of clubs or organizations: Not on file    Relationship status: Not on file  Other Topics Concern  . Not on file  Social History Narrative  .  Not on file     Works as a Conservator, museum/gallery  Allergies:  No Known Allergies  Metabolic Disorder Labs: No results found for: HGBA1C, MPG No results found for: PROLACTIN No results found for: CHOL, TRIG, HDL, CHOLHDL, VLDL, LDLCALC   Current Medications: Current Outpatient Medications  Medication Sig Dispense Refill  . escitalopram (LEXAPRO) 10 MG tablet Take 1 tablet (10 mg total) by mouth daily. 30 tablet 1  . meloxicam (MOBIC) 15 MG tablet Take 1 tablet (15 mg total) by mouth daily. Take with food each morning (Patient not taking: Reported on 12/04/2017) 15 tablet 0  . norethindrone-ethinyl estradiol (JUNEL FE,GILDESS FE,LOESTRIN FE) 1-20 MG-MCG  tablet Take 1 tablet by mouth daily.    . SPRINTEC 28 0.25-35 MG-MCG tablet Take 1 tablet by mouth daily.  4   No current facility-administered medications for this visit.       Psychiatric Specialty Exam: Review of Systems  Cardiovascular: Negative for chest pain.  Skin: Negative for rash.  Psychiatric/Behavioral: Negative for depression and substance abuse.    There were no vitals taken for this visit.There is no height or weight on file to calculate BMI.  General Appearance:   Eye Contact:  Speech:  Normal Rate  Volume:  Decreased  Mood:  fair  Affect:  Congruent  Thought Process:  Goal Directed  Orientation:  Full (Time, Place, and Person)  Thought Content:  Logical and Rumination  Suicidal Thoughts:  No  Homicidal Thoughts:  No  Memory:  Immediate;   Fair Recent;   Fair  Judgement:  Fair  Insight:  Shallow  Psychomotor Activity:  Decreased  Concentration:  Concentration: Fair and Attention Span: Fair  Recall:  [employer] of Knowledge:Good  Language: Good  Akathisia:  No  Handed:  Right  AIMS (if indicated):    Assets:  Desire for Improvement  ADL's:  Intact  Cognition: WNL  Sleep:  variable    Treatment Plan Summary: Medication management and Plan as follows  MDD, mild recurrent; doing fair. Continue lexapro GAD: better, continue lexapro Continue to work on limiting worry time and coping skill therapy  I discussed the assessment and treatment plan with the patient. The patient was provided an opportunity to ask questions and all were answered. The patient agreed with the plan and demonstrated an understanding of the instructions.   The patient was advised to call back or seek an in-person evaluation if the symptoms worsen or if the condition fails to improve as anticipated.  I provided 15 minutes of non-face-to-face time during this encounter. Fu 22m. Renewed meds   Merian Capron, MD 6/12/202010:37 AM

## 2018-07-23 ENCOUNTER — Ambulatory Visit (HOSPITAL_COMMUNITY): Payer: PRIVATE HEALTH INSURANCE | Admitting: Licensed Clinical Social Worker

## 2018-07-24 ENCOUNTER — Ambulatory Visit (INDEPENDENT_AMBULATORY_CARE_PROVIDER_SITE_OTHER): Payer: PRIVATE HEALTH INSURANCE | Admitting: Licensed Clinical Social Worker

## 2018-07-24 DIAGNOSIS — F411 Generalized anxiety disorder: Secondary | ICD-10-CM | POA: Diagnosis not present

## 2018-07-24 DIAGNOSIS — F330 Major depressive disorder, recurrent, mild: Secondary | ICD-10-CM | POA: Diagnosis not present

## 2018-07-24 NOTE — Progress Notes (Signed)
Virtual Visit via Telephone Note  I connected with Wendy Meyers on 07/24/18 at  9:00 AM EDT by telephone and verified that I am speaking with the correct person using two identifiers.   I discussed the limitations, risks, security and privacy concerns of performing an evaluation and management service by telephone and the availability of in person appointments. I also discussed with the patient that there may be a patient responsible charge related to this service. The patient expressed understanding and agreed to proceed.  Follow Up Instructions:    I discussed the assessment and treatment plan with the patient. The patient was provided an opportunity to ask questions and all were answered. The patient agreed with the plan and demonstrated an understanding of the instructions.   The patient was advised to call back or seek an in-person evaluation if the symptoms worsen or if the condition fails to improve as anticipated.  I provided 53 minutes of non-face-to-face time during this encounter.   THERAPIST PROGRESS NOTE  Session Time: 9:04 AM to 9:57 AM  Participation Level: Active  Behavioral Response: CasualAlertappropriate  Type of Therapy: Individual Therapy  Treatment Goals addressed:  Increase energy and enjoyment of activities   Interventions: CBT, Solution Focused, Strength-based, Supportive and Other: strengthening self-esteem, coping  Summary: Wendy Meyers is a 28 y.o. female who presents with I'm ok for the most part, a couple bad days. A day when woke up not in the best mood and rode it out. Talk to one friend a lot, hung out with friend on Monday, otherwise work. Being at home and learning to do different audits that I haven't done before, hard doing without people around.(people from work) Not going back until September.  Reviewed possible advantages of being at home. Some advantages but they are not a big deal for me. Things such as waking up later, wearing what she wants,  not commuting.   Reviewed recognizing negative schemas. Identifies feeling nothing and worthless because he made me feel like that. I didn't understand it until get older. Some days I understand and some days it comes back.  Shares negative feelings about herself that they line up with who I am so seek approval from others and try to be in good graces with them. Reviewed negative schemas coming from the past and challenging them-I hear it but hard, whole life I thought of myself as a good person, friend, people who care of I put them first, then people leave and they take a piece of you. Reviewed importance of making herself a priority.   Reviewed self-esteem and importance of self-care. Do things but short-term fix. I watch TV and movies and have a couple of video games I play. Reviewed list of self-care activities and relates she prefers to come up with her own ideas.  Reviewed session and relates that what you said is important just hard to figure out how to get there.  Suicidal/Homicidal: No  Therapist Response: Therapist assessed patient current functioning per report and processed feelings related to stressors. Validated patient on how she was feeling. Reviewed source of anxiety and depression and pointed out 1 of underlying sources for symptoms is relationship with dad where she felt valued and unwanted.  Explained that schema developed from this experience, possibly a default setting of how she views the world.  Important to identify schema as distorted and develop from this experience but not accurate or relevant to present day. Shared that for those who have carried a lifelong  sense of insecurity, the way to self-worth both developing the ability to give yourself with your parents could not.  It is possible to overcome deficits from your past only by becoming a good parent to yourself.  Reviewed childhood circumstances of an overly critical.  And parental rejection and impacts of feeling to  children that they are wanted have led to patient growing up with feelings of insecurity or inadequacy.  But profoundly damaging attitude of her rejecting parent teach as a child to grow up telling her very right to excess.  Such a person has a tendency toward self rejection or self sabotage.  Remains possible for adults with such a past to overcome with their parents did not give them through learning to loving care for themselves.  The purpose of remembering your past is to release it and refilled your present.  Old tapes are patterns based on fear, guilt, or anger will tend to interfere with your present life and relationships until you can identify and release them.  1 of the pathways that may help to work releasing past may be to forgive them but also acknowledged for patient this may be difficult and the focus will be on releasing the past to move forward.  And releasing the past you can begin the journey of learning to care for yourself.  It means becoming a good parent to herself.  Explained there are different pathways self-esteem and one is to learn to take care of yourself. Reviewed making time for small acts of self nurturing on a daily basis and reviewed types of activities.  Is to implement more activities and to routine.  Explained that important realization is caring for her friends great qualities to have but also she has to make sure to take care of herself and important for functioning to make herself a priority. Discussed difficulty of letting go of an accurate perspective is based on the past may be related to the nature of automatic thoughts.  Provided education on automatic thoughts that we can have from 70,000 to 100,000 of them every day-they are constantly helping us to interpret the world around us, describing what is happening, and trying to make sense of it by helping us interpret events, sites, sounds, smells, feelings.  Without even realizing it, we are interpreting and giving her  own meetings to everything happening around us.  Thoughts are simply electro chemical impulses in our brains that they are not statements of fact.  Epictetus, in the first century, said:  "Men are not disturbed by things, but by the view they take of them".  CBT says that it is not the event which causes the emotion (and our behavioral reaction) but the meaning we give that event - or what we think ABOUT that event. Because of our previous experiences, our upbringing, our culture, religious beliefs and family values, we may well make very different interpretations and evaluations of situations than someone else. One reason it may be difficult to challenge her thoughts is nature of automatic thoughts are Believable, automatic, habitual and persistent, can happen so quickly with words, image memory of physical sensation or based on the intuition of a sense of just knowing. Explained concept of self-esteem that all of us have unconditional self-worth and there are strategies we can do to help us feel connected to understanding and feeling our value as welling as strengthening our self-esteem.  Explained value coming externally is not good foundation as externals are not prominent and subject  to change rather self-esteem comes internally from a fundamental belief that you are okay, that it can not be earned externally by seeking the approval or validation of others  Plan: Return again in 3 weeks.2. Patient to review Youtube video "[facility] Need [facility]" by Filiberto Pinksracy McMillan.3.  Patient to identify and implement into her schedule self-care activity. 4.Therapist continue to work with patient on strategies to decrease anxiety and depression  Diagnosis: Axis I: GAD                          MDD recurrent, mild    Axis II: No diagnosis    Coolidge BreezeMary Bowman, LCSW 07/24/2018

## 2018-08-12 ENCOUNTER — Ambulatory Visit (INDEPENDENT_AMBULATORY_CARE_PROVIDER_SITE_OTHER): Payer: PRIVATE HEALTH INSURANCE | Admitting: Licensed Clinical Social Worker

## 2018-08-12 DIAGNOSIS — F330 Major depressive disorder, recurrent, mild: Secondary | ICD-10-CM

## 2018-08-12 DIAGNOSIS — F411 Generalized anxiety disorder: Secondary | ICD-10-CM
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
## 2018-08-24 ENCOUNTER — Ambulatory Visit (HOSPITAL_COMMUNITY): Payer: PRIVATE HEALTH INSURANCE | Admitting: Licensed Clinical Social Worker

## 2018-09-10 ENCOUNTER — Ambulatory Visit (INDEPENDENT_AMBULATORY_CARE_PROVIDER_SITE_OTHER): Payer: PRIVATE HEALTH INSURANCE | Admitting: Licensed Clinical Social Worker

## 2018-09-10 DIAGNOSIS — F411 Generalized anxiety disorder: Secondary | ICD-10-CM

## 2018-09-10 DIAGNOSIS — F330 Major depressive disorder, recurrent, mild: Secondary | ICD-10-CM

## 2018-09-10 NOTE — Progress Notes (Addendum)
Virtual Visit via Telephone Note  I connected with Wendy Meyers on 09/10/18 at  4:00 PM EDT by telephone and verified that I am speaking with the correct person using two identifiers.   I discussed the limitations, risks, security and privacy concerns of performing an evaluation and management service by telephone and the availability of in person appointments. I also discussed with the patient that there may be a patient responsible charge related to this service. The patient expressed understanding and agreed to proceed.  Follow Up Instructions:    I discussed the assessment and treatment plan with the patient. The patient was provided an opportunity to ask questions and all were answered. The patient agreed with the plan and demonstrated an understanding of the instructions.   The patient was advised to call back or seek an in-person evaluation if the symptoms worsen or if the condition fails to improve as anticipated.  I provided 53 minutes of non-face-to-face time during this encounter.   THERAPIST PROGRESS NOTE  Session Time: 4:02 PM to 4:55 PM  Participation Level: Active  Behavioral Response: CasualAlertDysphoric  Type of Therapy: Individual Therapy  Treatment Goals addressed:  strengthen self-esteem, improve mood, helpful relationship strategies  Interventions: Solution Focused, Strength-based, Supportive and Other: coping, strategies to strengthen self-esteem, mood regulation strategies  Summary: Wendy Meyers is a 28 y.o. female who presents with up and down. It has been difficult for her having a relationship with married man. No clarity with how he feels. Met and became intimate, I did things for him, ended quickly. He was frustrated and wanted a relief and told her that was not all that he wanted. Not sure what he wants.  Shares that she holds back from asking for clarity because she is scared, does not know where he stands does not want a wreck relationship and then be  devastated, along with that work like to be wrecked.  Discussed other activities to engage in I can distract her and also remind her that her life is bigger than just this relationship.  Suggested online dating is an option, a virtual group could be fun. Has to study for [employer] and has to focus on this. Virtual activities-depends on how want to interact with people, sometimes want to be by myself.   Shares how he acts toward her makes me think that he is still interested.  Met up a couple days ago and messed around.  Says that he misses me, that I am 1 of his favorite people.  It is killing me everyday.  Reviewed likelihood of things staying the same if there is no clarity and having to accept this until something happens that will change it.  Reviewed patient moving into a new house and has a new kit on the way and what that says patient shares may be 1 of the reasons not together because of the kids and financial problems come from a divorce.   Distraction try but hard can't not think about it and draining.  He is back from furlough and talk through channels from work. Helps but have to focus more on work and miss him more. It does help to make things a little easier when things he wouldn't do these things if didn't care.  Knows I have been hurt by people and know how I react so part of me thinks he wouldn't use or hurt me. Wouldn't do this knowing how much could cost for his marriage and if sexually involved would not do  it if didn't care.   Reviewed strengthening self-esteem that will be helpful for coping and homework is for patient to develop positive self statements and to say daily to herself.  Suicidal/Homicidal: No  Therapist Response: Therapist assessed patient current functioning per report and processed feelings related to major stressor of relationship with friend who is also married.  Reviewed finding out helpful to know more about how he feels about the relationship and where it is going will  help her with current position and having more certainty.  Reviewed looking at consequences of decision she decides to make, pros and cons of her choices.  Looked at asking for more clarity could rock the boat and in relationship which she does not want to happen versus not bringing up and being okay with things going on the way they are until subject is brought up for some other type of external event causes changes.   Validated and normalized how she was feeling.  Encourage patient to do positive things for herself so that the relationship does not consume her but sees things outside of the relationship that are part of her life that she enjoys helping and appreciating aspects of her life.discussed distraction is helpful reviewed different ways to distract and encourage patient that she may have to force herself into doing something to help with mood change.  Also activities that are comforting for her can also help to decrease negative mood.   Reviewed treatment goals and identified self-esteem as relevant to focus of treatment. Reviewed self-esteem issues and underlying source come from past relationships related to fears of rejection, negative experiences of friends who left her.  Discussed patient coming up with positive statement about herself and saying daily as it will begin to help her change negative patterns in her brain enhance how she feels about herself which will help her cope with current stressful situation.  Provided strength based and supportive intervention.  Link to Treatment goals:  strengthen self-esteem, improve mood, helpful relationship strategies Patient provided verbal consent to complete treatment plan virtually Plan: Return again in 2 weeks.2.  Patient develop and start saying daily of positive self statements to help self-esteem strengthen. 3.  Therapist continue to work with patient on healthy relationship skills, mood management and strengthening  self-esteem  Diagnosis: Axis I: GAD                          MDD recurrent, mild    Axis II: No diagnosis    Cordella Register, LCSW 09/10/2018

## 2018-09-17 ENCOUNTER — Encounter (HOSPITAL_COMMUNITY): Payer: Self-pay | Admitting: Psychiatry

## 2018-09-17 ENCOUNTER — Ambulatory Visit (INDEPENDENT_AMBULATORY_CARE_PROVIDER_SITE_OTHER): Payer: PRIVATE HEALTH INSURANCE | Admitting: Psychiatry

## 2018-09-17 ENCOUNTER — Other Ambulatory Visit: Payer: Self-pay

## 2018-09-17 DIAGNOSIS — F330 Major depressive disorder, recurrent, mild: Secondary | ICD-10-CM | POA: Diagnosis not present

## 2018-09-17 DIAGNOSIS — F411 Generalized anxiety disorder: Secondary | ICD-10-CM | POA: Diagnosis not present

## 2018-09-17 MED ORDER — ESCITALOPRAM OXALATE 10 MG PO TABS: 10.0000 mg | ORAL_TABLET | Freq: Every day | ORAL | 2 refills | Status: DC
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
## 2018-09-25 ENCOUNTER — Ambulatory Visit (HOSPITAL_COMMUNITY): Payer: PRIVATE HEALTH INSURANCE | Admitting: Licensed Clinical Social Worker

## 2018-09-30 ENCOUNTER — Ambulatory Visit (INDEPENDENT_AMBULATORY_CARE_PROVIDER_SITE_OTHER): Payer: PRIVATE HEALTH INSURANCE | Admitting: Licensed Clinical Social Worker

## 2018-09-30 DIAGNOSIS — F411 Generalized anxiety disorder: Secondary | ICD-10-CM

## 2018-09-30 DIAGNOSIS — F330 Major depressive disorder, recurrent, mild: Secondary | ICD-10-CM | POA: Diagnosis not present

## 2018-09-30 NOTE — Progress Notes (Signed)
Virtual Visit via Telephone Note  I connected with [name] on 09/30/18 at 11:00 AM EDT by telephone and verified that I am speaking with the correct person using two identifiers.   I discussed the limitations, risks, security and privacy concerns of performing an evaluation and management service by telephone and the availability of in person appointments. I also discussed with the patient that there may be a patient responsible charge related to this service. The patient expressed understanding and agreed to proceed.   I discussed the assessment and treatment plan with the patient. The patient was provided an opportunity to ask questions and all were answered. The patient agreed with the plan and demonstrated an understanding of the instructions.   The patient was advised to call back or seek an in-person evaluation if the symptoms worsen or if the condition fails to improve as anticipated.  I provided 55 minutes of non-face-to-face time during this encounter.   THERAPIST PROGRESS NOTE  Session Time: 11:01 AM to 11:56 AM  Participation Level: Active  Behavioral Response: CasualAlertAnxious and Depressed  Type of Therapy: Individual Therapy  Treatment Goals addressed:  strengthen self-esteem, improve mood, helpful relationship strategies Interventions: Motivational Interviewing, Solution Focused, Strength-based, Supportive, Reframing and Other: effective interpersonal relationships  Summary: Dawnielle Christiana is a 28 y.o. female who presents with depression, anxious, I find that every time get down harder for me to get up. More than not lately, Everyday. Bothers me that underlying problems from childhood, main identifiable cause is this boy. I know that I should let it go but it would hurt but it would get better. Tired when wake up when sleep. Wake up in middle of the night, he is in my dreams too.   Suicidal/Homicidal: No  Therapist Response: Discussed main stressor of her relationship  and she doesn't know if it is getting better.  Working during the day and studying at night for [employer]. It is a complication with relationship. If we see each other I am the one that goes there, during lunch parking lot in a park. Visit if we can. He knows the scared part, related to relationship with dad, past relationships. If rock the boat and bring up where this is going, afraid of losing friendship. Messed up from past with relationships and dad. Cling on to close and then they leave. I am in love with him, I don't want to say that to him then he says we are just friends. He must have feelings for me. It kills me a little everyday. Talk to him every day. Concern about their status all the time. Therapist utilized reframing for patient to that he is fortunate to be in a relationship with her and if he can't see that then he is the one that misses out. Pointed out that the past schemas are leaking into the relationship in ways that are harmful for her.   Asked patient if things are ok the way they are. Patient shares that he cares for me wouldn't do the things he has done. Everything is on his time, don't know if can do it anymore. As much as I love him part of it is that it is too much. If we stop his life doesn't change. He lost nothing. I lost everything and I get hurt. Even now he is not suffering the way she is. I would suffer the most, I never get angry and I am the one that screwed all the time. With everything and everybody. I  am left all the time, I am the one who puts more effort into relationships, I don't know how not to be that way. I am on the back burner, never first, I want to be considered first not when it is convenient for you.  Provided education on interpersonal schemas that they are organizing models formed early in life in the context of relationships with caregivers.  They reflect ideas about the self and others and about how the relationship between this self and others work.  They guide  behaviors and expectations in relationships that are automatically activated in interpersonal situations.  The task of developing the schema from relationship is disrupted and distorted when raised with parenting that has been dysfunctional.  What was adaptive in childhood may be maladaptive in adulthood and could lead inadvertently toward repeating negative relationship patterns.  They play a central role in shaping thoughts, feelings and behaviors throughout life since people's expectations about relationships lead them to behave in ways to prepare for the imagine outcomes.  However schemas are modifiable once patterns are identified and some things about relationships are more critically evaluated there is room for trying out alternative ways of interacting which could significantly improve current relationships.  Identified maladaptive schema for patient that she is always screwed.  Reviewed this as an expectation on her relationships worked in session on modifying this.  Discussed part of changing pattern is working on feelings and beliefs about self, working on Programmer, systemsappreciation and valuing self, self-compassion, will help change patterns. Also challenging expectation of rejection.  Guided patient in processing feelings about relationship further and shares that she is not ready to it go. He makes me happy. He has shared that he is happy when he is with her. Not asking for more clarity involves in part holding on to this relationship and part that she does not want to be rejected.  Reviewed working on negative schemas in relationships will help her with strategies to address current situation.  Reviewed session and patient shared helped in more understanding of where I want to go and not going to happen over night. Also that it will get to the point where this is enough. Therapist accessed strengthening self-esteem will help in changing interpersonal schemas that are not helpful.   Plan: Return again in 3-4  weeks.2.therapist work on challenging and changing negative interpersonal patterns.2.Therapist work with patient on healthy coping for relationships, strengthen self-esteem, mood management  Diagnosis: Axis I: GAD                          MDD recurrent, mild    Axis II: No diagnosis    Coolidge BreezeMary Keylor Rands, LCSW 09/30/2018

## 2018-11-01 ENCOUNTER — Other Ambulatory Visit (HOSPITAL_COMMUNITY): Payer: Self-pay | Admitting: Psychiatry

## 2018-11-09 ENCOUNTER — Ambulatory Visit (INDEPENDENT_AMBULATORY_CARE_PROVIDER_SITE_OTHER): Payer: PRIVATE HEALTH INSURANCE | Admitting: Licensed Clinical Social Worker

## 2018-11-09 DIAGNOSIS — F411 Generalized anxiety disorder: Secondary | ICD-10-CM

## 2018-11-09 DIAGNOSIS — F330 Major depressive disorder, recurrent, mild: Secondary | ICD-10-CM | POA: Diagnosis not present

## 2018-11-09 NOTE — Progress Notes (Signed)
Virtual Visit via Telephone Note  I connected with Wendy Meyers on 11/09/18 at  2:00 PM EDT by telephone and verified that I am speaking with the correct person using two identifiers.   I discussed the limitations, risks, security and privacy concerns of performing an evaluation and management service by telephone and the availability of in person appointments. I also discussed with the patient that there may be a patient responsible charge related to this service. The patient expressed understanding and agreed to proceed.   I discussed the assessment and treatment plan with the patient. The patient was provided an opportunity to ask questions and all were answered. The patient agreed with the plan and demonstrated an understanding of the instructions.   The patient was advised to call back or seek an in-person evaluation if the symptoms worsen or if the condition fails to improve as anticipated.  I provided 55 minutes of non-face-to-face time during this encounter.  THERAPIST PROGRESS NOTE  Session Time: 2:01 PM to 2:56 PM  Participation Level: Active  Behavioral Response: CasualAlertDysphoric  Type of Therapy: Individual Therapy  Treatment Goals addressed: strengthen self-esteem, improve mood, helpful relationship strategies  Interventions: Motivational Interviewing, Solution Focused, Strength-based, Supportive and Other: coping, relationship skills  Summary: Wendy Meyers is a 28 y.o. female who presents with not okay. Struggle with my friend and the whole affair thing. Frustrated with getting clarity with him. Constant merry go round with him. He tells her about his Inconvenience making sacrifices. Patient gets it. Don't just want to be the girl that is there for him to help him through frustration.  Patient describes these types of interactions as nothing there is nothing there for her.  In situations where she has to be the one to put all the effort and so she can meet him when it is  convenient for him. Feel used. I don't see him changing. Has a baby coming, secure mortgage.  Asked him a question about how he felt about her and where they were at. He relates taking things day by day, not thinking about future, so patient did not get clarity. Patient wonders if he is avoiding the question.  Admits needs not getting met in relationship  Shared trying to get answers is like pulling teeth, does not know how to feel, needing to know where he is out with things.   Therapist provided positive feedback for trying to get answers clarity and standing up for her needs,  showed strength. Also feedback that she can't get clarity if he is unwilling to do that for her. May have to accept that for now. Accept that not to get the attention she may want because married and won't be able to give that to her.     Destroyed her mood because all I can think about.  Reviewed if she would be happy ff it continues? Not sure.  Patient shares I am hoping that he realize that he wants me too. You wouldn't start all this without a good reason. Wouldn't need me for just sex. Discusses his connection with her that reciprocated patient's statement that it means a lot to him too and important to him. Needs to know where he is at and hasn't gotten clarity although shares he would deal with all this unless it did matter Patient reflected doesn't leave me in a good position. He is getting everything he wants.  Reviewed patient ambivalent and working to resolve ambivalence. Shares a part wants to walk away and  a part doesn' want to lose him when together happy.   Shared they will have a 6-week break with birth of baby will be in so much limbo when no will be focused on him, I will be a busy time at work and not as easy to see each other.  Reviewed this practice a good thing for patient may help her with perspective, has been exhausted fighting for what ever she can get, may ease intensity of emotions as well with break    Shared things with best friend a way for her to release how she is feeling Suicidal/Homicidal: No  Therapist Response: Therapist assessed patient current functioning per report helping patient process feelings related to difficulties with emotions related to relationship.  Therapist asked patient questions to help her with clarity of how she felt and decision making.  Reviewed whether she would want to be in this type of long-term relationship and recognize at the same time by sacrificing to stay in relationship may prevent her from finding the right person.  Utilized motivational interviewing to look at pros and cons of staying in relationship.  Looked that ways she is at a disadvantage, having to sacrifice her needs because of the situation, situation not being balanced.  And the positive side looking at being happy with this person and wanting them to be part of her life.  Reviewed may not get a commitment from him even though he cares about him. Discussed lack of clarity on his part making it difficult.  Helping patient to get clarity relating patient may have to accept current situation the way it is given signs he is not ready to change and whether she is ready to do that. Discussed ways she could compartmentalize things so focus as not always on him, how focusing on him so much is not a good approach particularly as he cannot do the same thing.  Reviewed options such as dating.  Discussed upcoming period of time where they will be taking a break as good for her she can get a break from situation that is draining and consuming, could possibly provide perspective with space, back away with less focus on him may help with how she wants to deal with the situation. Provided strength based and supportive interventions.  Plan: Return again in 2-3 weeks.2.working through ambivalence, emotional regulation, healthy relationship skills, coping  Diagnosis: Axis I: GAD                          MDD recurrent,  mild    Axis II: No diagnosis    Cordella Register, LCSW 11/09/2018

## 2018-12-02 ENCOUNTER — Ambulatory Visit (INDEPENDENT_AMBULATORY_CARE_PROVIDER_SITE_OTHER): Payer: PRIVATE HEALTH INSURANCE | Admitting: Licensed Clinical Social Worker

## 2018-12-02 ENCOUNTER — Other Ambulatory Visit: Payer: Self-pay

## 2018-12-02 DIAGNOSIS — F411 Generalized anxiety disorder: Secondary | ICD-10-CM

## 2018-12-02 DIAGNOSIS — F331 Major depressive disorder, recurrent, moderate: Secondary | ICD-10-CM

## 2018-12-02 NOTE — Progress Notes (Signed)
Virtual Visit via Telephone Note  I connected with Wendy Meyers on 12/02/18 at 10:00 AM EDT by telephone and verified that I am speaking with the correct person using two identifiers.   I discussed the limitations, risks, security and privacy concerns of performing an evaluation and management service by telephone and the availability of in person appointments. I also discussed with the patient that there may be a patient responsible charge related to this service. The patient expressed understanding and agreed to proceed.   I discussed the assessment and treatment plan with the patient. The patient was provided an opportunity to ask questions and all were answered. The patient agreed with the plan and demonstrated an understanding of the instructions.   The patient was advised to call back or seek an in-person evaluation if the symptoms worsen or if the condition fails to improve as anticipated.  I provided 55 minutes of non-face-to-face time during this encounter.   THERAPIST PROGRESS NOTE  Session Time: 10:02 AM to 10:57 AM  Participation Level: Active  Behavioral Response: AlertDepressed and Dysphoric  Type of Therapy: Individual Therapy  Treatment Goals addressed:  strengthen self-esteem, improve mood, helpful relationship strategies  Interventions: Motivational Interviewing, Solution Focused, Strength-based, Supportive and Other: coping  Summary: Wendy Meyers is a 28 y.o. female who presents with not great. He had his second baby on Monday.  Describes that it hurts her a lot, angry did not reach out to her to even share he had the baby.  Described aspects of relationship where he meets up with her to get his frustrations out that are one-sided and patient feels used.  Therapist asked patient whether situation was fair to her?  It appeared unbalanced where his needs are getting met while at the same time patient's needs not getting met.  Therapist processed with patient feelings of  anger, feelings of hurt.  Asked herself questions like why would he get involved with me knowing he was having a child and also knowing my issues with my dad.  Describes wanting to tell him he is getting into dad territory making her feel the way her dad made her feel.  Therapist encouraged patient to be directing communicating but also shared her impression that it did not seem he was giving her clarity and whether she wanted to continue with things that the way they were.  Utilize motivational interviewing techniques to look at pros and cons of current situation.  Looking at the fact she is not happy and distressed often and if this is the type of relationship she wants for herself.  Pointed out to patient that she does deserve more and that problems in relationships occur because needs are not getting met and there is not good communication.  Also explored with patient looking at times such as his having a new baby well also having a young child and if this may say something about his focus being on the family.  Patient became self-critical how could she let him do this to her, engage in a toxic relationship.  Pointed out this is not her and never wanted to put herself in this type of situation.  Therapist challenged patient on being self-critical remembering when she started there was a lot of caring on both sides with the way things have evolved to have different than what she expected.  Pointed out a lot of people get in relationships and each of the people have different expectations that lead to disappointment by somebody.  Also  that expectations change over time so a lot of people can find themselves in situation where they thought they would have commitment and the other person did not give that to them.  Reviewed session things there will be time apart while he is on paternity leave and she can think more about where she is at about things.  Gust of general aspects of healthy relationship include getting  needs met and in communication and recognizing this is happening in current relationship to cause her distress.  This provided strength based and supportive interventions  suicidal/Homicidal: No  Plan: Return again in 3-4 weeks.2.  Therapist continue to process patient's feelings related to current relationship, explored healthy coping strategies and making healthy choices for herself that we will to crease distress and improve mood  Diagnosis: Axis I:  GAD                          MDD moderate    Axis II: No diagnosis    Cordella Register, LCSW 12/02/2018

## 2018-12-21 ENCOUNTER — Ambulatory Visit (INDEPENDENT_AMBULATORY_CARE_PROVIDER_SITE_OTHER): Payer: PRIVATE HEALTH INSURANCE | Admitting: Psychiatry

## 2018-12-21 ENCOUNTER — Encounter (HOSPITAL_COMMUNITY): Payer: Self-pay | Admitting: Psychiatry

## 2018-12-21 DIAGNOSIS — F411 Generalized anxiety disorder: Secondary | ICD-10-CM

## 2018-12-21 DIAGNOSIS — F330 Major depressive disorder, recurrent, mild: Secondary | ICD-10-CM

## 2018-12-21 DIAGNOSIS — F331 Major depressive disorder, recurrent, moderate: Secondary | ICD-10-CM

## 2018-12-21 MED ORDER — ESCITALOPRAM OXALATE 10 MG PO TABS: 10.0000 mg | ORAL_TABLET | Freq: Every day | ORAL | 0 refills | Status: DC

## 2018-12-21 MED ORDER — BUPROPION HCL ER (SR) 100 MG PO TB12: 100.0000 mg | ORAL_TABLET | Freq: Two times a day (BID) | ORAL | 1 refills | Status: DC

## 2018-12-21 NOTE — Progress Notes (Signed)
[facility] Outpatient Follow up visit   Patient Identification: Monserratt Knezevic MRN:  [id number] Date of Evaluation:  12/21/2018 Referral Source: Sherlyn Lees, MD Chief Complaint:   depression follow up Visit Diagnosis:    ICD-10-CM   1. GAD (generalized anxiety disorder)  F41.1   2. Moderate episode of recurrent major depressive disorder (HCC)  F33.1   3. Major depressive disorder, recurrent episode, mild degree (HCC)  F33.0    Virtual Visit via Telephone Note   I connected with Rhett Bannister on 12/21/18 at  1:30 PM EST by telephone and verified that I am speaking with the correct person using two identifiers   I discussed the limitations, risks, security and privacy concerns of performing an evaluation and management service by telephone and the availability of in person appointments. I also discussed with the patient that there may be a patient responsible charge related to this service. The patient expressed understanding and agreed to proceed.    I discussed the assessment and treatment plan with the patient. The patient was provided an opportunity to ask questions and all were answered. The patient agreed with the plan and demonstrated an understanding of the instructions.   The patient was advised to call back or seek an in-person evaluation if the symptoms worsen or if the condition fails to improve as anticipated. History of Present Illness: 28  years old currently single white femaleinitially referred by primary care physician for management of anxiety and depression  Gets some down days, relationship concerns . Working in therapy At times difficult to focus Preparing for [employer] exams Mom is supportive Anxiety fluctuates Felt  bullied when young   Modifying factor: family, house Aggravating factor:bullied when young Duration: more then 10 years On and off counselling for depression when young   Past Psychiatric History: depression, anxiety  Previous Psychotropic Medications: Yes    Substance Abuse History in the last 12 months:  Yes.    Consequences of Substance Abuse: Medical Consequences:  fatigue  Past Medical History:  Past Medical History:  Diagnosis Date  . Anxiety and depression     Past Surgical History:  Procedure Laterality Date  . CHOLECYSTECTOMY      Family Psychiatric History: sister : anxiety Mom possible depression  Family History:  Family History  Problem Relation Age of Onset  . Rheum arthritis Mother   . Cancer Father   . Anxiety disorder Sister     Social History:   Social History   Socioeconomic History  . Marital status: Single    Spouse name: Not on file  . Number of children: Not on file  . Years of education: Not on file  . Highest education level: Not on file  Occupational History  . Not on file  Social Needs  . Financial resource strain: Not on file  . Food insecurity    Worry: Not on file    Inability: Not on file  . Transportation needs    Medical: Not on file    Non-medical: Not on file  Tobacco Use  . Smoking status: Never Smoker  . Smokeless tobacco: Never Used  Substance and Sexual Activity  . Alcohol use: Yes    Alcohol/week: 1.0 standard drinks    Types: 1 Glasses of wine per week    Comment: 1 every few days  . Drug use: Not on file  . Sexual activity: Not on file  Lifestyle  . Physical activity    Days per week: Not on file  Minutes per session: Not on file  . Stress: Not on file  Relationships  . Social Herbalist on phone: Not on file    Gets together: Not on file    Attends religious service: Not on file    Active member of club or organization: Not on file    Attends meetings of clubs or organizations: Not on file    Relationship status: Not on file  Other Topics Concern  . Not on file  Social History Narrative  . Not on file     Works as a Conservator, museum/gallery  Allergies:  No Known Allergies  Metabolic Disorder Labs: No results found for: HGBA1C, MPG No results  found for: PROLACTIN No results found for: CHOL, TRIG, HDL, CHOLHDL, VLDL, LDLCALC   Current Medications: Current Outpatient Medications  Medication Sig Dispense Refill  . buPROPion (WELLBUTRIN SR) 100 MG 12 hr tablet Take 1 tablet (100 mg total) by mouth 2 (two) times daily. 30 tablet 1  . escitalopram (LEXAPRO) 10 MG tablet Take 1 tablet (10 mg total) by mouth daily. 90 tablet 0  . meloxicam (MOBIC) 15 MG tablet Take 1 tablet (15 mg total) by mouth daily. Take with food each morning (Patient not taking: Reported on 12/04/2017) 15 tablet 0  . norethindrone-ethinyl estradiol (JUNEL FE,GILDESS FE,LOESTRIN FE) 1-20 MG-MCG tablet Take 1 tablet by mouth daily.    . SPRINTEC 28 0.25-35 MG-MCG tablet Take 1 tablet by mouth daily.  4   No current facility-administered medications for this visit.       Psychiatric Specialty Exam: Review of Systems  Cardiovascular: Negative for chest pain.  Skin: Negative for rash.  Psychiatric/Behavioral: Negative for depression and substance abuse.    There were no vitals taken for this visit.There is no height or weight on file to calculate BMI.  General Appearance:   Eye Contact:  Speech:  Normal Rate  Volume:  Decreased  Mood: fair  Affect:  Congruent  Thought Process:  Goal Directed  Orientation:  Full (Time, Place, and Person)  Thought Content:  Logical and Rumination  Suicidal Thoughts:  No  Homicidal Thoughts:  No  Memory:  Immediate;   Fair Recent;   Fair  Judgement:  Fair  Insight:  Shallow  Psychomotor Activity:  Decreased  Concentration:  Concentration: Fair and Attention Span: Fair  Recall:  [employer] of Knowledge:Good  Language: Good  Akathisia:  No  Handed:  Right  AIMS (if indicated):    Assets:  Desire for Improvement  ADL's:  Intact  Cognition: WNL  Sleep:  variable    Treatment Plan Summary: Medication management and Plan as follows  MDD, mild recurrent; some subdued, add wellbutrin 100mg  for depression and possible  inattention GAD: manageable continue lexapro and continue therapy  Continue to work on limiting worry time and coping skill therapy  Fu 51m.    Merian Capron, MD 11/16/20201:37 PM

## 2018-12-28 ENCOUNTER — Ambulatory Visit (HOSPITAL_COMMUNITY): Payer: PRIVATE HEALTH INSURANCE | Admitting: Licensed Clinical Social Worker

## 2018-12-28 NOTE — Progress Notes (Unsigned)
   THERAPIST PROGRESS NOTE  Session Time: ***  Participation Level: {BHH PARTICIPATION LEVEL:22264}  Behavioral Response: {Appearance:22683}{BHH LEVEL OF CONSCIOUSNESS:22305}{BHH MOOD:22306}  Type of Therapy: {CHL AMB BH Type of Therapy:21022741}  Treatment Goals addressed: {CHL AMB BH Treatment Goals Addressed:21022754}  Interventions: {CHL AMB BH Type of Intervention:21022753}  Summary: Wendy Meyers is a 28 y.o. female who presents with ***.   Suicidal/Homicidal: {BHH YES OR NO:22294}{yes/no/with/without intent/plan:22693}  Therapist Response: ***  Plan: Return again in *** weeks.  Diagnosis: Axis I: {psych axis 1:31909}    Axis II: {psych axis 2:31910}    Cordella Register, LCSW 12/28/2018

## 2019-02-02 ENCOUNTER — Ambulatory Visit (INDEPENDENT_AMBULATORY_CARE_PROVIDER_SITE_OTHER): Payer: PRIVATE HEALTH INSURANCE | Admitting: Licensed Clinical Social Worker

## 2019-02-02 ENCOUNTER — Other Ambulatory Visit: Payer: Self-pay

## 2019-02-02 DIAGNOSIS — F411 Generalized anxiety disorder: Secondary | ICD-10-CM | POA: Diagnosis not present

## 2019-02-02 DIAGNOSIS — F331 Major depressive disorder, recurrent, moderate: Secondary | ICD-10-CM | POA: Diagnosis not present

## 2019-02-02 NOTE — Progress Notes (Signed)
Virtual Visit via Telephone Note  I connected with Wendy Meyers on 02/02/19 at  9:00 AM EST by telephone and verified that I am speaking with the correct person using two identifiers.   I discussed the limitations, risks, security and privacy concerns of performing an evaluation and management service by telephone and the availability of in person appointments. I also discussed with the patient that there may be a patient responsible charge related to this service. The patient expressed understanding and agreed to proceed.   I discussed the assessment and treatment plan with the patient. The patient was provided an opportunity to ask questions and all were answered. The patient agreed with the plan and demonstrated an understanding of the instructions.   The patient was advised to call back or seek an in-person evaluation if the symptoms worsen or if the condition fails to improve as anticipated.  I provided 53 minutes of non-face-to-face time during this encounter.   THERAPIST PROGRESS NOTE  Session Time: 9:02 AM to 9:55 AM  Participation Level: Active  Behavioral Response: AlertDysphoric-subdued in session  Type of Therapy: Individual Therapy  Treatment Goals addressed: strengthen self-esteem, improve mood, helpful relationship strategies  Interventions: Solution Focused, Strength-based, Supportive and Other: relationship skills   Summary: Wendy Meyers is a 28 y.o. female who presents with busy. Reviewed mood and shares that t hasn't been great because of the guy who she has been seeing. "Something happened that didn't sit well and still doesn't sit well with me". Shared that she is hurt and angry by what he said. His needs always get met and never hers. Other things too, talk to him about personal things. Before he will talk to her about it but now he says that he doesn't know what to tell her. Shares that everything has changed. Asking herself questions whether she is being cared for  and loved and and finding the answer that she is hurt from the relationship. Speak up doesn't matter it is always about what he needs and wants. Doesn't see him as changing. "I want more and deserve more. Months ago thought it would break my heart and kill me but makes me angry, not fair. I didn't start it he did and he is the one who pursued it." He gets what he wants all the time, shares her feelings of being used, and that she is not getting the minimal. Has felt like she has deserved it as she is the mistress, and he is married. Therapist challenged her on belief that she deserved it, pointing out she deserves to be loved and cared for like anybody else.  Shares that it wouldn't bother her if he sacrificed something but he hasn't sacrificed while patient has sacrifice everything.  Patient shares regretting that she ever did this.  Shares this relationship reminding her exactly of her relationship with dad someone who says they care, shows the care over little time and then becomes what is convenient for them. (Therapist work with patient on recognizing and changing automatic interpersonal schemas to help her with relationships see below) Shares that her mom has told her that people do not love the way she does, should not expect them to. Gets upset when they don't meet her half way.  Therapist reiterated patient's right to have love and same effort she puts in be reciprocated in the relationship.  Summary of session reviewed how patient was feeling and she relates that it was hard back when it first started that she feels  better than she has but it still hard.  It is still happening and still fresh, but will be not working together ar work and thinks it will be helpful to not be working together so closely.  Therapist pointed out this will give more time for reflection and thought about relationship, how she feels and making good choices for herself.  Reviewed recognizing interpersonal schema that are automatic  and patient relates she sees it but hard to do something about it.  Therapist pointed out that these  discussions bring awareness and strategies to change unhelpful  interpersonal schemas  Suicidal/Homicidal: No  Therapist Response: Reviewed symptoms, discussed stressors, facilitated expression of thoughts and feelings, provided education on understanding relationship patterns and interpersonal schemas.  Identified interpersonal schema patient is automatically activating in her relationships based on early relationship with father.  Reviewed ways she can begin to rework this pattern by first looking at the interpersonal situation asking what happened, who was involved, asking herself what did she feel believe about herself, asking how did she expect the other person to act and respond to her.  Then looking at resulting action, what did she do what was the result.  These basic questions helping her to identify the interpersonal schema and helpful in reworking the schema is by generating alternative a more flexible interpersonal schemas.  This patient develops healthier schema questions that can be helpful to develop alternative schemas include asking herself what are the goals in this situation?  What else could I feel believe about myself?  How else could I expect the other person to act respond to me?  What else could I do.  Assessed patient needing to work on agency in the relationship to stand up and advocate for her needs.  Pointed out to patient that in session labeling and naming the pattern helps patient to become aware and to be able to change pattern.  Provided strength based on supportive intervention  Plan: Return again in 3 weeks. 2.Continue to work with patient on changing relationship patterns, and work on agency in relationship so that she expresses her right and needs in the relationship.   Diagnosis: Axis I:  GAD                          MDD moderate    Axis II: No diagnosis    Cordella Register, LCSW 02/02/2019

## 2019-02-13 ENCOUNTER — Other Ambulatory Visit (HOSPITAL_COMMUNITY): Payer: Self-pay | Admitting: Psychiatry

## 2019-02-25 ENCOUNTER — Ambulatory Visit (INDEPENDENT_AMBULATORY_CARE_PROVIDER_SITE_OTHER): Payer: PRIVATE HEALTH INSURANCE | Admitting: Licensed Clinical Social Worker

## 2019-02-25 ENCOUNTER — Other Ambulatory Visit: Payer: Self-pay

## 2019-02-25 DIAGNOSIS — F331 Major depressive disorder, recurrent, moderate: Secondary | ICD-10-CM

## 2019-02-25 DIAGNOSIS — F411 Generalized anxiety disorder: Secondary | ICD-10-CM

## 2019-02-25 NOTE — Progress Notes (Signed)
Virtual Visit via Telephone Note  I connected with Wendy Meyers on 02/25/19 at  4:00 PM EST by telephone and verified that I am speaking with the correct person using two identifiers.   I discussed the limitations, risks, security and privacy concerns of performing an evaluation and management service by telephone and the availability of in person appointments. I also discussed with the patient that there may be a patient responsible charge related to this service. The patient expressed understanding and agreed to proceed.  Follow Up Instructions:    I discussed the assessment and treatment plan with the patient. The patient was provided an opportunity to ask questions and all were answered. The patient agreed with the plan and demonstrated an understanding of the instructions.   The patient was advised to call back or seek an in-person evaluation if the symptoms worsen or if the condition fails to improve as anticipated.  I provided 30 minutes of non-face-to-face time during this encounter.  THERAPIST PROGRESS NOTE  Session Time: 4:00 PM to 4:30 PM Participation Level: Active  Behavioral Response: CasualAlertAnxious  Type of Therapy: Individual Therapy  Treatment Goals addressed: strengthen self-esteem, improve mood, helpful relationship strategies  Interventions: Solution Focused, Strength-based, Supportive and Reframing  Summary: Wendy Meyers is a 29 y.o. female who presents with very busy. Updated patient as to her relationship. She talks to him every day at work. Catch up as much as can. Things haven't changed, haven't seen each other, phone sex a couple of times, don't have time to think about it. Mood has been awful mostly because of work, don't have time to do everything she needs to do and so much to do, behind though she has tried not to be. She stayed up until  2:30 AM last night. Don't have time to think about anything but work, life is in chaos when look at her house, don't  have anytime to do anything else.  Therapist reviewed with patient that this happens at this time of year for her which patient relates that this year more challenging that she has more responsibility, more challenging more responsibility.  Therapist reviewed making sure she has time for self-care and patient relates she has no time for self-care.  She has been working like this since January 4 nonstop, and will be through to the end of April.  She has been working to not fall behind but falling behind anyway, terrible because helping other people, that takes time,  has to help them because if not they will keep making the same error.  Shares for example this week she has 3 audits and is only gotten through 1.  No time for self-care. January 4 nonstop work to prevent falling behind, 3 audits first one only.  Reviewed with patient reminding herself of the positive things such as she is in a good career at a good company has gone far in her career, also feedback from therapist that it seems at least she does not have any new episodes of being hurt in relationship and did out how difficult it has been struggling that provides relief from that, patient at the same time relating that work is a different ways to feel miserable.  Courage patient with using time management strategies that will help her in completing tasks help work to get through what she needs to get through.  Therapist provided strength based and supportive intervention  Suicidal/Homicidal: No  Plan: 1.patient will call when things slow down at work patient continued  to utilize emotional regulation skills, healthy relationship skills, coping   Diagnosis: Axis I: GAD                          MDD moderate    Axis II: No diagnosis    Coolidge Breeze, LCSW 02/25/2019

## 2019-03-09 ENCOUNTER — Other Ambulatory Visit (HOSPITAL_COMMUNITY): Payer: Self-pay | Admitting: Psychiatry

## 2019-03-21 ENCOUNTER — Other Ambulatory Visit (HOSPITAL_COMMUNITY): Payer: Self-pay | Admitting: Psychiatry

## 2019-03-22 ENCOUNTER — Encounter (HOSPITAL_COMMUNITY): Payer: Self-pay | Admitting: Psychiatry

## 2019-03-22 ENCOUNTER — Ambulatory Visit (INDEPENDENT_AMBULATORY_CARE_PROVIDER_SITE_OTHER): Payer: PRIVATE HEALTH INSURANCE | Admitting: Psychiatry

## 2019-03-22 DIAGNOSIS — F411 Generalized anxiety disorder: Secondary | ICD-10-CM | POA: Diagnosis not present

## 2019-03-22 DIAGNOSIS — F331 Major depressive disorder, recurrent, moderate: Secondary | ICD-10-CM

## 2019-03-22 MED ORDER — BUPROPION HCL ER (SR) 200 MG PO TB12: 200.0000 mg | ORAL_TABLET | Freq: Two times a day (BID) | ORAL | 1 refills | Status: DC

## 2019-03-22 NOTE — Progress Notes (Signed)
[facility] Outpatient Follow up visit   Patient Identification: Wendy Meyers MRN:  [id number] Date of Evaluation:  03/22/2019 Referral Source: Sherlyn Lees, MD Chief Complaint:   depression follow up Visit Diagnosis:  No diagnosis found. Virtual Visit via Telephone Note   I connected with Wendy Meyers on 03/22/19 at  1:30 PM EST by telephone and verified that I am speaking with the correct person using two identifiers.  I discussed the limitations, risks, security and privacy concerns of performing an evaluation and management service by telephone and the availability of in person appointments. I also discussed with the patient that there may be a patient responsible charge related to this service. The patient expressed understanding and agreed to proceed.    I discussed the assessment and treatment plan with the patient. The patient was provided an opportunity to ask questions and all were answered. The patient agreed with the plan and demonstrated an understanding of the instructions.   The patient was advised to call back or seek an in-person evaluation if the symptoms worsen or if the condition fails to improve as anticipated. History of Present Illness: 29  years old currently single white femaleinitially referred by primary care physician for management of anxiety and depression  Did not elaborate on relationship concerns today but feeling tired at times, more time to finish work but being given more work, some unhappiness with her job Has to work late  wellbutrin was added last visit , hasnt seen much benefit, does not endorse hopelessness Preparing for [employer] exams Mom is supportive Anxiety fluctuates Felt  bullied when young   Modifying factor: family, house Aggravating factor:bullied when young Duration:; more then 10 years  On and off counselling for depression when young   Past Psychiatric History: depression, anxiety  Previous Psychotropic Medications: Yes   Substance Abuse  History in the last 12 months:  Yes.    Consequences of Substance Abuse: Medical Consequences:  fatigue  Past Medical History:  Past Medical History:  Diagnosis Date  . Anxiety and depression     Past Surgical History:  Procedure Laterality Date  . CHOLECYSTECTOMY      Family Psychiatric History: sister : anxiety Mom possible depression  Family History:  Family History  Problem Relation Age of Onset  . Rheum arthritis Mother   . Cancer Father   . Anxiety disorder Sister     Social History:   Social History   Socioeconomic History  . Marital status: Single    Spouse name: Not on file  . Number of children: Not on file  . Years of education: Not on file  . Highest education level: Not on file  Occupational History  . Not on file  Tobacco Use  . Smoking status: Never Smoker  . Smokeless tobacco: Never Used  Substance and Sexual Activity  . Alcohol use: Yes    Alcohol/week: 1.0 standard drinks    Types: 1 Glasses of wine per week    Comment: 1 every few days  . Drug use: Not on file  . Sexual activity: Not on file  Other Topics Concern  . Not on file  Social History Narrative  . Not on file   Social Determinants of Health   Financial Resource Strain:   . Difficulty of Paying Living Expenses: Not on file  Food Insecurity:   . Worried About Programme researcher, broadcasting/film/video in the Last Year: Not on file  . Ran Out of Food in the Last Year: Not on file  Transportation Needs:   . Film/video editor (Medical): Not on file  . Lack of Transportation (Non-Medical): Not on file  Physical Activity:   . Days of Exercise per Week: Not on file  . Minutes of Exercise per Session: Not on file  Stress:   . Feeling of Stress : Not on file  Social Connections:   . Frequency of Communication with Friends and Family: Not on file  . Frequency of Social Gatherings with Friends and Family: Not on file  . Attends Religious Services: Not on file  . Active Member of Clubs or  Organizations: Not on file  . Attends Archivist Meetings: Not on file  . Marital Status: Not on file     Works as a Conservator, museum/gallery  Allergies:  No Known Allergies  Metabolic Disorder Labs: No results found for: HGBA1C, MPG No results found for: PROLACTIN No results found for: CHOL, TRIG, HDL, CHOLHDL, VLDL, LDLCALC   Current Medications: Current Outpatient Medications  Medication Sig Dispense Refill  . buPROPion (WELLBUTRIN SR) 200 MG 12 hr tablet Take 1 tablet (200 mg total) by mouth 2 (two) times daily. 30 tablet 1  . escitalopram (LEXAPRO) 10 MG tablet TAKE 1 TABLET BY MOUTH EVERY DAY 90 tablet 0  . meloxicam (MOBIC) 15 MG tablet Take 1 tablet (15 mg total) by mouth daily. Take with food each morning (Patient not taking: Reported on 12/04/2017) 15 tablet 0  . norethindrone-ethinyl estradiol (JUNEL FE,GILDESS FE,LOESTRIN FE) 1-20 MG-MCG tablet Take 1 tablet by mouth daily.    . SPRINTEC 28 0.25-35 MG-MCG tablet Take 1 tablet by mouth daily.  4   No current facility-administered medications for this visit.      Psychiatric Specialty Exam: Review of Systems  Cardiovascular: Negative for chest pain.  Skin: Negative for rash.  Psychiatric/Behavioral: Positive for depression. Negative for substance abuse.    There were no vitals taken for this visit.There is no height or weight on file to calculate BMI.  General Appearance:   Eye Contact:  Speech:  Normal Rate  Volume:  Decreased  Mood: subdued  Affect:  Congruent  Thought Process:  Goal Directed  Orientation:  Full (Time, Place, and Person)  Thought Content:  Logical and Rumination  Suicidal Thoughts:  No  Homicidal Thoughts:  No  Memory:  Immediate;   Fair Recent;   Fair  Judgement:  Fair  Insight:  Shallow  Psychomotor Activity:  Decreased  Concentration:  Concentration: Fair and Attention Span: Fair  Recall:  [employer] of Knowledge:Good  Language: Good  Akathisia:  No  Handed:  Right  AIMS (if  indicated):    Assets:  Desire for Improvement  ADL's:  Intact  Cognition: WNL  Sleep:  variable    Treatment Plan Summary: Medication management and Plan as follows  MDD, mild to moderate recurrent; subdued increase wellbutrin to 200mg  provided supportive therapy, she is feeling unsure of the profession,  GAD: manageable, continue lexapro more concern of decreased motivation. See above Continue to work on limiting worry time and coping skill therapy  Fu 56m or earlier this time .     Merian Capron, MD 2/15/20211:39 PM

## 2019-04-04 ENCOUNTER — Other Ambulatory Visit (HOSPITAL_COMMUNITY): Payer: Self-pay | Admitting: Psychiatry

## 2019-05-01 ENCOUNTER — Ambulatory Visit: Payer: Self-pay | Attending: Internal Medicine

## 2019-05-01 DIAGNOSIS — Z23 Encounter for immunization: Secondary | ICD-10-CM

## 2019-05-01 NOTE — Progress Notes (Signed)
   Covid-19 Vaccination Clinic  Name:  Jeaneane Adamec    MRN: [id number] DOB: 1990/03/06  05/01/2019  Ms. Barcelona was observed post Covid-19 immunization for 15 minutes without incident. She was provided with Vaccine Information Sheet and instruction to access the V-Safe system.   Ms. Avans was instructed to call 911 with any severe reactions post vaccine: Marland Kitchen Difficulty breathing  . Swelling of face and throat  . A fast heartbeat  . A bad rash all over body  . Dizziness and weakness   Immunizations Administered    Name Date Dose VIS Date Route   Pfizer COVID-19 Vaccine 05/01/2019  4:25 PM 0.3 mL 01/15/2019 Intramuscular   Manufacturer: [employer], [employer]   Lot: [id number]   NDC: [address]-2

## 2019-05-10 ENCOUNTER — Other Ambulatory Visit (HOSPITAL_COMMUNITY): Payer: Self-pay | Admitting: Psychiatry

## 2019-05-25 ENCOUNTER — Ambulatory Visit: Payer: Self-pay | Attending: Internal Medicine

## 2019-05-25 DIAGNOSIS — Z23 Encounter for immunization: Secondary | ICD-10-CM

## 2019-05-25 NOTE — Progress Notes (Signed)
   Covid-19 Vaccination Clinic  Name:  Wendy Meyers    MRN: [id number] DOB: 29-Oct-1990  05/25/2019  [name] was observed post Covid-19 immunization for 15 minutes without incident. She was provided with Vaccine Information Sheet and instruction to access the V-Safe system.   [name] was instructed to call 911 with any severe reactions post vaccine: Marland Kitchen Difficulty breathing  . Swelling of face and throat  . A fast heartbeat  . A bad rash all over body  . Dizziness and weakness   Immunizations Administered    Name Date Dose VIS Date Route   Pfizer COVID-19 Vaccine 05/25/2019  3:47 PM 0.3 mL 03/31/2018 Intramuscular   Manufacturer: [employer], [employer]   Lot: [id number]   NDC: [address]-2

## 2019-06-03 ENCOUNTER — Other Ambulatory Visit (HOSPITAL_COMMUNITY): Payer: Self-pay | Admitting: Psychiatry

## 2019-06-28 ENCOUNTER — Other Ambulatory Visit (HOSPITAL_COMMUNITY): Payer: Self-pay | Admitting: Psychiatry

## 2019-09-03 ENCOUNTER — Other Ambulatory Visit (HOSPITAL_COMMUNITY): Payer: Self-pay | Admitting: Psychiatry

## 2019-09-07 ENCOUNTER — Encounter (HOSPITAL_COMMUNITY): Payer: Self-pay | Admitting: Psychiatry

## 2019-09-07 ENCOUNTER — Telehealth (HOSPITAL_COMMUNITY): Payer: PRIVATE HEALTH INSURANCE | Admitting: Psychiatry

## 2019-09-07 ENCOUNTER — Telehealth (INDEPENDENT_AMBULATORY_CARE_PROVIDER_SITE_OTHER): Payer: PRIVATE HEALTH INSURANCE | Admitting: Psychiatry

## 2019-09-07 DIAGNOSIS — F411 Generalized anxiety disorder: Secondary | ICD-10-CM

## 2019-09-07 DIAGNOSIS — F331 Major depressive disorder, recurrent, moderate: Secondary | ICD-10-CM | POA: Diagnosis not present

## 2019-09-07 MED ORDER — ESCITALOPRAM OXALATE 10 MG PO TABS: 10.0000 mg | ORAL_TABLET | Freq: Every day | ORAL | 0 refills | Status: DC

## 2019-09-07 MED ORDER — BUPROPION HCL ER (SR) 200 MG PO TB12: 200.0000 mg | ORAL_TABLET | Freq: Every day | ORAL | 2 refills | Status: AC

## 2019-09-07 NOTE — Progress Notes (Signed)
[facility] Outpatient Follow up visit   Patient Identification: Twana Wileman MRN:  [id number] Date of Evaluation:  09/07/2019 Referral Source: Sherlyn Lees, MD Chief Complaint:   depression follow up Visit Diagnosis:    ICD-10-CM   1. Moderate episode of recurrent major depressive disorder (HCC)  F33.1   2. GAD (generalized anxiety disorder)  F41.1    Virtual Visit via Telephone Note    I connected with Rhett Bannister on 09/07/19 at  3:15 PM EDT by telephone and verified that I am speaking with the correct person using two identifiers.  I discussed the limitations, risks, security and privacy concerns of performing an evaluation and management service by telephone and the availability of in person appointments. I also discussed with the patient that there may be a patient responsible charge related to this service. The patient expressed understanding and agreed to proceed.    I discussed the assessment and treatment plan with the patient. The patient was provided an opportunity to ask questions and all were answered. The patient agreed with the plan and demonstrated an understanding of the instructions.   The patient was advised to call back or seek an in-person evaluation if the symptoms worsen or if the condition fails to improve as anticipated. Patient location home Provider location home  History of Present Illness: 29  years old currently single white femaleinitially referred by primary care physician for management of anxiety and depression  Did not elaborate on relationship concerns.  Says work is main stressor but overall feels meds are at right dose   Mom is supportive Anxiety fluctuates Felt  bullied when young   Modifying factor: family, house Aggravating factor:bullied when young Duration:; more then 10 years  On and off counselling for depression when young   Past Psychiatric History: depression, anxiety  Previous Psychotropic Medications: Yes   Substance Abuse History  in the last 12 months:  Yes.    Consequences of Substance Abuse: Medical Consequences:  fatigue  Past Medical History:  Past Medical History:  Diagnosis Date  . Anxiety and depression     Past Surgical History:  Procedure Laterality Date  . CHOLECYSTECTOMY      Family Psychiatric History: sister : anxiety Mom possible depression  Family History:  Family History  Problem Relation Age of Onset  . Rheum arthritis Mother   . Cancer Father   . Anxiety disorder Sister     Social History:   Social History   Socioeconomic History  . Marital status: Single    Spouse name: Not on file  . Number of children: Not on file  . Years of education: Not on file  . Highest education level: Not on file  Occupational History  . Not on file  Tobacco Use  . Smoking status: Never Smoker  . Smokeless tobacco: Never Used  Vaping Use  . Vaping Use: Never used  Substance and Sexual Activity  . Alcohol use: Yes    Alcohol/week: 1.0 standard drink    Types: 1 Glasses of wine per week    Comment: 1 every few days  . Drug use: Not on file  . Sexual activity: Not on file  Other Topics Concern  . Not on file  Social History Narrative  . Not on file   Social Determinants of Health   Financial Resource Strain:   . Difficulty of Paying Living Expenses:   Food Insecurity:   . Worried About Programme researcher, broadcasting/film/video in the Last Year:   . Ran  Out of Food in the Last Year:   Transportation Needs:   . Lack of Transportation (Medical):   Marland Kitchen Lack of Transportation (Non-Medical):   Physical Activity:   . Days of Exercise per Week:   . Minutes of Exercise per Session:   Stress:   . Feeling of Stress :   Social Connections:   . Frequency of Communication with Friends and Family:   . Frequency of Social Gatherings with Friends and Family:   . Attends Religious Services:   . Active Member of Clubs or Organizations:   . Attends Banker Meetings:   Marland Kitchen Marital Status:      Works as a  Comptroller  Allergies:  No Known Allergies  Metabolic Disorder Labs: No results found for: HGBA1C, MPG No results found for: PROLACTIN No results found for: CHOL, TRIG, HDL, CHOLHDL, VLDL, LDLCALC   Current Medications: Current Outpatient Medications  Medication Sig Dispense Refill  . buPROPion (WELLBUTRIN SR) 200 MG 12 hr tablet Take 1 tablet (200 mg total) by mouth daily. 30 tablet 2  . escitalopram (LEXAPRO) 10 MG tablet Take 1 tablet (10 mg total) by mouth daily. 90 tablet 0  . meloxicam (MOBIC) 15 MG tablet Take 1 tablet (15 mg total) by mouth daily. Take with food each morning (Patient not taking: Reported on 12/04/2017) 15 tablet 0  . norethindrone-ethinyl estradiol (JUNEL FE,GILDESS FE,LOESTRIN FE) 1-20 MG-MCG tablet Take 1 tablet by mouth daily.    . SPRINTEC 28 0.25-35 MG-MCG tablet Take 1 tablet by mouth daily.  4   No current facility-administered medications for this visit.      Psychiatric Specialty Exam: Review of Systems  Cardiovascular: Negative for chest pain.  Skin: Negative for rash.  Psychiatric/Behavioral: Positive for depression. Negative for substance abuse.    There were no vitals taken for this visit.There is no height or weight on file to calculate BMI.  General Appearance:   Eye Contact:  Speech:  Normal Rate  Volume:  Decreased  Mood:fair  Affect:  Congruent  Thought Process:  Goal Directed  Orientation:  Full (Time, Place, and Person)  Thought Content:  Logical and Rumination  Suicidal Thoughts:  No  Homicidal Thoughts:  No  Memory:  Immediate;   Fair Recent;   Fair  Judgement:  Fair  Insight:  Shallow  Psychomotor Activity:  Decreased  Concentration:  Concentration: Fair and Attention Span: Fair  Recall:  [employer] of Knowledge:Good  Language: Good  Akathisia:  No  Handed:  Right  AIMS (if indicated):    Assets:  Desire for Improvement  ADL's:  Intact  Cognition: WNL  Sleep:  variable    Treatment Plan Summary: Medication  management and Plan as follows  MDD, mild to moderate recurrent; work stress gets her subdued otherwise handling meds and not worse. Continue wellbutrin, should consider therapy GAD: manageable, continue lexapro  Continue to work on limiting worry time and coping skill therapy, or cut down work hours if too stressful  Fu 2-85m or earlier if needed, refills sent Non face to face time spent .     [name], MD 8/3/20213:24 PM

## 2019-09-21 ENCOUNTER — Emergency Department (INDEPENDENT_AMBULATORY_CARE_PROVIDER_SITE_OTHER)
Admission: RE | Admit: 2019-09-21 | Discharge: 2019-09-21 | Disposition: A | Discharge status: Home / Self Care | Payer: PRIVATE HEALTH INSURANCE | Source: Ambulatory Visit | Attending: Family Medicine | Admitting: Family Medicine

## 2019-09-21 ENCOUNTER — Emergency Department (INDEPENDENT_AMBULATORY_CARE_PROVIDER_SITE_OTHER): Payer: PRIVATE HEALTH INSURANCE

## 2019-09-21 ENCOUNTER — Other Ambulatory Visit: Payer: Self-pay

## 2019-09-21 VITALS — BP 124/86 | HR 93 | Temp 99.1°F | Resp 16

## 2019-09-21 DIAGNOSIS — R1010 Upper abdominal pain, unspecified: Secondary | ICD-10-CM

## 2019-09-21 LAB — POCT CBC W AUTO DIFF (K'VILLE URGENT CARE)

## 2019-09-21 LAB — POCT URINALYSIS DIP (MANUAL ENTRY)
Glucose, UA: NEGATIVE mg/dL
Leukocytes, UA: NEGATIVE
Nitrite, UA: NEGATIVE
Spec Grav, UA: 1.025 (ref 1.010–1.025)
Urobilinogen, UA: 1 E.U./dL
pH, UA: 6 (ref 5.0–8.0)

## 2019-09-21 MED ORDER — ONDANSETRON 4 MG PO TBDP: 4.0000 mg | ORAL_TABLET | Freq: Three times a day (TID) | ORAL | 0 refills | Status: DC | PRN

## 2019-09-21 MED ORDER — HYDROCODONE-ACETAMINOPHEN 5-325 MG PO TABS: ORAL_TABLET | ORAL | 0 refills | Status: DC

## 2019-09-21 NOTE — Discharge Instructions (Signed)
Begin clear liquids for about 12 to 18 hours, then may begin a BRAT diet (Bananas, Rice, Applesauce, Toast) when abdominal pain improved. Then gradually advance to a regular diet as tolerated.   If symptoms become significantly worse during the night or over the weekend, proceed to the local emergency room.

## 2019-09-21 NOTE — ED Provider Notes (Signed)
[facility]    CSN: [id number] Arrival date & time: 09/21/19  1452      History   Chief Complaint Chief Complaint  Patient presents with  . Appointment    3:00  . Abdominal Pain    HPI Wendy Meyers is a 29 y.o. female.   Patient complains of onset of abdominal pain yesterday at 3pm.  The pain started in her left upper abdomen/flank and then spread across her upper abdomen.  She initially had nausea without vomiting, and her bowel movements have been normal. She denies urinary symptoms.  She presented to the local [facility] ED last night but eventually left AMA because of the long wait.  Review of the ED record reveals that her CMP was normal (except mild elevation glucose 102) and normal lipase. Patient reports that her pain has improved today.  She describes the pain as sharp and cramping, worse when walking and supine, and improved when sitting.  She states that she is having normal menses at present, and denies vaginal discharge prior to the pain.  Tylenol has not been helpful. Past history of cholecystectomy.  Further review of records reveals a CT renal scan done 04/19/19 that showed punctate stones in the right kidney.  The history is provided by the patient.    Past Medical History:  Diagnosis Date  . Anxiety and depression     Patient Active Problem List   Diagnosis Date Noted  . Mild episode of recurrent major depressive disorder (HCC) 09/25/2016    Past Surgical History:  Procedure Laterality Date  . CHOLECYSTECTOMY      OB History   No obstetric history on file.      Home Medications    Prior to Admission medications   Medication Sig Start Date End Date Taking? Authorizing Provider  buPROPion (WELLBUTRIN SR) 200 MG 12 hr tablet Take 1 tablet (200 mg total) by mouth daily. 09/07/19   [name], MD  escitalopram (LEXAPRO) 10 MG tablet Take 1 tablet (10 mg total) by mouth daily. 09/07/19   [name], MD  HYDROcodone-acetaminophen  (NORCO/VICODIN) 5-325 MG tablet Take one by mouth at bedtime as needed for pain 09/21/19   Lattie Haw, MD  meloxicam (MOBIC) 15 MG tablet Take 1 tablet (15 mg total) by mouth daily. Take with food each morning Patient not taking: Reported on 12/04/2017 08/05/14   Lattie Haw, MD  norethindrone-ethinyl estradiol (JUNEL FE,GILDESS FE,LOESTRIN FE) 1-20 MG-MCG tablet Take 1 tablet by mouth daily.    [provider]  ondansetron (ZOFRAN ODT) 4 MG disintegrating tablet Take 1 tablet (4 mg total) by mouth every 8 (eight) hours as needed for nausea or vomiting. Dissolve under tongue 09/21/19   Lattie Haw, MD  SPRINTEC 28 0.25-35 MG-MCG tablet Take 1 tablet by mouth daily. 10/27/17   [provider]    Family History Family History  Problem Relation Age of Onset  . Rheum arthritis Mother   . Cancer Father   . Anxiety disorder Sister     Social History Social History   Tobacco Use  . Smoking status: Never Smoker  . Smokeless tobacco: Never Used  Vaping Use  . Vaping Use: Every day  . Substances: CBD, Flavoring  Substance Use Topics  . Alcohol use: Yes    Alcohol/week: 1.0 standard drink    Types: 1 Glasses of wine per week    Comment: 1 every few days  . Drug use: Not on file  Allergies   Patient has no known allergies.   Review of Systems Review of Systems  Constitutional: Positive for activity change and appetite change. Negative for chills, diaphoresis, fatigue and fever.  HENT: Negative.   Eyes: Negative.   Respiratory: Negative.   Cardiovascular: Negative.   Gastrointestinal: Positive for abdominal pain and nausea. Negative for abdominal distention, anal bleeding, blood in stool, constipation, diarrhea, rectal pain and vomiting.  Genitourinary: Positive for flank pain. Negative for dysuria, frequency, genital sores, hematuria, menstrual problem, pelvic pain, urgency, vaginal discharge and vaginal pain.  Skin: Negative.   Neurological:  Negative.   Hematological: Negative.      Physical Exam Triage Vital Signs ED Triage Vitals  Enc Vitals Group     BP 09/21/19 1511 124/86     Pulse Rate 09/21/19 1511 93     Resp 09/21/19 1511 16     Temp 09/21/19 1511 99.1 F (37.3 C)     Temp Source 09/21/19 1511 Oral     SpO2 09/21/19 1511 100 %     Weight --      Height --      Head Circumference --      Peak Flow --      Pain Score 09/21/19 1509 5     Pain Loc --      Pain Edu? --      Excl. in GC? --    No data found.  Updated Vital Signs BP 124/86 (BP Location: Right Arm)   Pulse 93   Temp 99.1 F (37.3 C) (Oral)   Resp 16   LMP 09/21/2019   SpO2 100%   Visual Acuity Right Eye Distance:   Left Eye Distance:   Bilateral Distance:    Right Eye Near:   Left Eye Near:    Bilateral Near:     Physical Exam Vitals and nursing note reviewed.  Constitutional:      General: She is not in acute distress.    Appearance: She is well-developed. She is not ill-appearing.  HENT:     Head: Normocephalic.     Right Ear: External ear normal.     Left Ear: External ear normal.     Nose: Nose normal.     Mouth/Throat:     Mouth: Mucous membranes are moist.     Pharynx: Oropharynx is clear.  Eyes:     Conjunctiva/sclera: Conjunctivae normal.     Pupils: Pupils are equal, round, and reactive to light.  Cardiovascular:     Rate and Rhythm: Normal rate and regular rhythm.     Heart sounds: Normal heart sounds.  Pulmonary:     Breath sounds: Normal breath sounds.  Abdominal:     General: Abdomen is flat. Bowel sounds are normal.     Palpations: Abdomen is soft.     Tenderness: There is generalized abdominal tenderness and tenderness in the periumbilical area. There is no right CVA tenderness, left CVA tenderness, guarding or rebound. Negative signs include McBurney's sign and psoas sign.       Comments: Patient points to her upper abdomen below the costal margin as location of her initial pain.  Exam reveals  diffuse mild tenderness to palpation as noted on diagram.   Musculoskeletal:     Cervical back: Neck supple.     Right lower leg: No edema.     Left lower leg: No edema.  Lymphadenopathy:     Cervical: No cervical adenopathy.  Skin:    General: Skin is  warm and dry.     Findings: No rash.  Neurological:     Mental Status: She is alert and oriented to person, place, and time.      UC Treatments / Results  Labs (all labs ordered are listed, but only abnormal results are displayed) Labs Reviewed  POCT URINALYSIS DIP (MANUAL ENTRY) - Abnormal; Notable for the following components:      Result Value   Bilirubin, UA small (*)    Ketones, POC UA trace (5) (*)    Blood, UA small (*)    Protein Ur, POC trace (*)    All other components within normal limits  AMYLASE  POCT CBC W AUTO DIFF ([facility]):  WBC 10.4; LY 28.7; MO 2.4; GR 68.9; Hgb 13.1; Platelets 356     EKG   Radiology DG Abd 2 Views  Result Date: 09/21/2019 CLINICAL DATA:  Upper abdominal pain since yesterday EXAM: ABDOMEN - 2 VIEW COMPARISON:  03/06/2010 FINDINGS: Supine and upright frontal views of the abdomen and pelvis are obtained. No bowel obstruction or ileus. No masses or abnormal calcifications. No free gas in the greater peritoneal sac. Lung bases are clear. Cholecystectomy clips right upper quadrant. IMPRESSION: 1. Unremarkable bowel gas pattern. Electronically Signed   By: Sharlet Salina M.D.   On: 09/21/2019 17:27    Procedures Procedures (including critical care time)  Medications Ordered in UC Medications - No data to display  Initial Impression / Assessment and Plan / UC Course  I have reviewed the triage vital signs and the nursing notes.  Pertinent labs & imaging results that were available during my care of the patient were reviewed by me and considered in my medical decision making (see chart for details).    Available lab results normal including CMP, lipase, KUB X-ray.  Urinalysis  non-specific with small Bili, trace KET, small blood. Source of patient's pain not clear.  Will add amylase (pending). Treat symptomatically for now.  Rx given for Zofran ODT 4mg , and Vicodin at bedtime (#5, no refill).  Controlled Substance Prescriptions I have consulted the [place] Controlled Substances Registry for this patient, and feel the risk/benefit ratio today is favorable for proceeding with this prescription for a controlled substance.   Followup with PCP in two days.   Final Clinical Impressions(s) / UC Diagnoses   Final diagnoses:  Pain of upper abdomen     Discharge Instructions     Begin clear liquids for about 12 to 18 hours, then may begin a BRAT diet (Bananas, Rice, Applesauce, Toast) when abdominal pain improved. Then gradually advance to a regular diet as tolerated.   If symptoms become significantly worse during the night or over the weekend, proceed to the local emergency room.     ED Prescriptions    Medication Sig Dispense Auth. Provider   HYDROcodone-acetaminophen (NORCO/VICODIN) 5-325 MG tablet Take one by mouth at bedtime as needed for pain 5 tablet , MD   ondansetron (ZOFRAN ODT) 4 MG disintegrating tablet Take 1 tablet (4 mg total) by mouth every 8 (eight) hours as needed for nausea or vomiting. Dissolve under tongue 12 tablet Lattie Haw, MD        Lattie Haw, MD 09/22/19 1031

## 2019-09-21 NOTE — ED Triage Notes (Signed)
Patient presents to Urgent Care with complaints of upper bilateral abdominal pian since 24 hours ago. Patient reports she went to the ED but LWBS due to extensive wait time. Pt denies nausea or diarrhea.

## 2019-09-22 LAB — AMYLASE: Amylase: 21 U/L (ref 21–101)

## 2019-11-02 ENCOUNTER — Telehealth (INDEPENDENT_AMBULATORY_CARE_PROVIDER_SITE_OTHER): Payer: 59 | Admitting: Psychiatry

## 2019-11-02 ENCOUNTER — Encounter (HOSPITAL_COMMUNITY): Payer: Self-pay | Admitting: Psychiatry

## 2019-11-02 DIAGNOSIS — F411 Generalized anxiety disorder: Secondary | ICD-10-CM

## 2019-11-02 DIAGNOSIS — F331 Major depressive disorder, recurrent, moderate: Secondary | ICD-10-CM | POA: Diagnosis not present

## 2019-11-02 MED ORDER — ESCITALOPRAM OXALATE 10 MG PO TABS: 15.0000 mg | ORAL_TABLET | Freq: Every day | ORAL | 0 refills | Status: DC

## 2019-11-02 NOTE — Progress Notes (Signed)
[facility] Outpatient Follow up visit   Patient Identification: Wendy Meyers MRN:  [id number] Date of Evaluation:  11/02/2019 Referral Source: Sherlyn Lees, MD Chief Complaint:   depression follow up Visit Diagnosis:    ICD-10-CM   1. Moderate episode of recurrent major depressive disorder (HCC)  F33.1   2. GAD (generalized anxiety disorder)  F41.1    Virtual Visit via Telephone Note    I connected with Wendy Meyers on 11/02/19 at  8:30 AM EDT by telephone and verified that I am speaking with the correct person using two identifiers.  I discussed the limitations, risks, security and privacy concerns of performing an evaluation and management service by telephone and the availability of in person appointments. I also discussed with the patient that there may be a patient responsible charge related to this service. The patient expressed understanding and agreed to proceed.    I discussed the assessment and treatment plan with the patient. The patient was provided an opportunity to ask questions and all were answered. The patient agreed with the plan and demonstrated an understanding of the instructions.   The patient was advised to call back or seek an in-person evaluation if the symptoms worsen or if the condition fails to improve as anticipated. Patient location home Provider location home office  History of Present Illness: 29  years old currently single white female initially referred by primary care physician for management of anxiety and depression  Early visit, says feel overwhelmed leading to difficulty in focusing, feels bored easily. Feels sad at times have done compulsive shopping. Has not been doing therapy but have re scheduled but its in November  Relationship with mom can be challenging and past relationships have been difficult    Mom is otherwise supportive Anxiety fluctuates Felt  bullied when young   Modifying factor: family, house Aggravating factor:bullied when  young Duration:; 10 plus years On and off counselling for depression when young   Past Psychiatric History: depression, anxiety  Previous Psychotropic Medications: Yes   Substance Abuse History in the last 12 months:  Yes.    Consequences of Substance Abuse: Medical Consequences:  fatigue  Past Medical History:  Past Medical History:  Diagnosis Date  . Anxiety and depression     Past Surgical History:  Procedure Laterality Date  . CHOLECYSTECTOMY      Family Psychiatric History: sister : anxiety Mom possible depression  Family History:  Family History  Problem Relation Age of Onset  . Rheum arthritis Mother   . Cancer Father   . Anxiety disorder Sister     Social History:   Social History   Socioeconomic History  . Marital status: Single    Spouse name: Not on file  . Number of children: Not on file  . Years of education: Not on file  . Highest education level: Not on file  Occupational History  . Not on file  Tobacco Use  . Smoking status: Never Smoker  . Smokeless tobacco: Never Used  Vaping Use  . Vaping Use: Every day  . Substances: CBD, Flavoring  Substance and Sexual Activity  . Alcohol use: Yes    Alcohol/week: 1.0 standard drink    Types: 1 Glasses of wine per week    Comment: 1 every few days  . Drug use: Not on file  . Sexual activity: Not on file  Other Topics Concern  . Not on file  Social History Narrative  . Not on file   Social Determinants of Health  Financial Resource Strain:   . Difficulty of Paying Living Expenses: Not on file  Food Insecurity:   . Worried About Programme researcher, broadcasting/film/video in the Last Year: Not on file  . Ran Out of Food in the Last Year: Not on file  Transportation Needs:   . Lack of Transportation (Medical): Not on file  . Lack of Transportation (Non-Medical): Not on file  Physical Activity:   . Days of Exercise per Week: Not on file  . Minutes of Exercise per Session: Not on file  Stress:   . Feeling of  Stress : Not on file  Social Connections:   . Frequency of Communication with Friends and Family: Not on file  . Frequency of Social Gatherings with Friends and Family: Not on file  . Attends Religious Services: Not on file  . Active Member of Clubs or Organizations: Not on file  . Attends Banker Meetings: Not on file  . Marital Status: Not on file     Works as a Comptroller  Allergies:  No Known Allergies  Metabolic Disorder Labs: No results found for: HGBA1C, MPG No results found for: PROLACTIN No results found for: CHOL, TRIG, HDL, CHOLHDL, VLDL, LDLCALC   Current Medications: Current Outpatient Medications  Medication Sig Dispense Refill  . buPROPion (WELLBUTRIN SR) 200 MG 12 hr tablet Take 1 tablet (200 mg total) by mouth daily. 30 tablet 2  . escitalopram (LEXAPRO) 10 MG tablet Take 1.5 tablets (15 mg total) by mouth daily. 45 tablet 0  . HYDROcodone-acetaminophen (NORCO/VICODIN) 5-325 MG tablet Take one by mouth at bedtime as needed for pain 5 tablet 0  . meloxicam (MOBIC) 15 MG tablet Take 1 tablet (15 mg total) by mouth daily. Take with food each morning (Patient not taking: Reported on 12/04/2017) 15 tablet 0  . norethindrone-ethinyl estradiol (JUNEL FE,GILDESS FE,LOESTRIN FE) 1-20 MG-MCG tablet Take 1 tablet by mouth daily.    . ondansetron (ZOFRAN ODT) 4 MG disintegrating tablet Take 1 tablet (4 mg total) by mouth every 8 (eight) hours as needed for nausea or vomiting. Dissolve under tongue 12 tablet 0  . SPRINTEC 28 0.25-35 MG-MCG tablet Take 1 tablet by mouth daily.  4   No current facility-administered medications for this visit.      Psychiatric Specialty Exam: Review of Systems  Cardiovascular: Negative for chest pain.  Skin: Negative for rash.  Psychiatric/Behavioral: Positive for depression. Negative for substance abuse.    There were no vitals taken for this visit.There is no height or weight on file to calculate BMI.  General  Appearance:   Eye Contact:  Speech:  Normal Rate  Volume:  Decreased  Mood:subdued  Affect:  Congruent  Thought Process:  Goal Directed  Orientation:  Full (Time, Place, and Person)  Thought Content:  Logical and Rumination  Suicidal Thoughts:  No  Homicidal Thoughts:  No  Memory:  Immediate;   Fair Recent;   Fair  Judgement:  Fair  Insight:  Shallow  Psychomotor Activity:  Decreased  Concentration:  Concentration: Fair and Attention Span: Fair  Recall:  [employer] of Knowledge:Good  Language: Good  Akathisia:  No  Handed:  Right  AIMS (if indicated):    Assets:  Desire for Improvement  ADL's:  Intact  Cognition: WNL  Sleep:  variable    Treatment Plan Summary: Medication management and Plan as follows  MDD, mild to moderate recurrent;  Feels subdued, increase lexapto to 15mg . Has the 10 mg tabs  for now. Have been recommending therapy, she has scheduled not but its in November Discussed to add activities to look forward to or to distract  GAD: stressed increase worries, increase lexapro to 15mg   Continue to work on limiting worry time and coping skill therapy, or cut down work hours if too stressful  Fu 3 weeks or earlier if needed Non face to face time spent .     , MD 9/28/20218:47 AM

## 2019-11-23 ENCOUNTER — Encounter (HOSPITAL_COMMUNITY): Payer: Self-pay | Admitting: Psychiatry

## 2019-11-23 ENCOUNTER — Telehealth (INDEPENDENT_AMBULATORY_CARE_PROVIDER_SITE_OTHER): Payer: 59 | Admitting: Psychiatry

## 2019-11-23 DIAGNOSIS — F411 Generalized anxiety disorder: Secondary | ICD-10-CM | POA: Diagnosis not present

## 2019-11-23 DIAGNOSIS — F331 Major depressive disorder, recurrent, moderate: Secondary | ICD-10-CM | POA: Diagnosis not present

## 2019-11-23 MED ORDER — ESCITALOPRAM OXALATE 10 MG PO TABS: 15.0000 mg | ORAL_TABLET | Freq: Every day | ORAL | 0 refills | Status: DC

## 2019-11-23 NOTE — Progress Notes (Signed)
[facility] Outpatient Follow up visit   Patient Identification: Wendy Meyers MRN:  [id number] Date of Evaluation:  11/23/2019 Referral Source: Sherlyn Lees, MD Chief Complaint:   depression follow up Visit Diagnosis:    ICD-10-CM   1. Moderate episode of recurrent major depressive disorder (HCC)  F33.1   2. GAD (generalized anxiety disorder)  F41.1    Virtual Visit via Telephone Note     I connected with Wendy Meyers on 11/23/19 at  3:30 PM EDT by telephone and verified that I am speaking with the correct person using two identifiers.  Location: Patient: home Provider: home office     I discussed the limitations, risks, security and privacy concerns of performing an evaluation and management service by telephone and the availability of in person appointments. I also discussed with the patient that there may be a patient responsible charge related to this service. The patient expressed understanding and agreed to proceed.    I discussed the assessment and treatment plan with the patient. The patient was provided an opportunity to ask questions and all were answered. The patient agreed with the plan and demonstrated an understanding of the instructions.   The patient was advised to call back or seek an in-person evaluation if the symptoms worsen or if the condition fails to improve as anticipated.   History of Present Illness: 29  years old currently single white female initially referred by primary care physician for management of anxiety and depression   Increased lexapro has helped some, less subdued but still feels down at times. Have scheduled therapy with Corrie Dandy, difficult relationship with mom    Anxiety fluctuates Felt  bullied when young   Modifying factor: family, house Aggravating factor:bullied when young Duration:; 10 plus years On and off counselling for depression when young   Past Psychiatric History: depression, anxiety  Previous Psychotropic Medications: Yes    Substance Abuse History in the last 12 months:  Yes.    Consequences of Substance Abuse: Medical Consequences:  fatigue  Past Medical History:  Past Medical History:  Diagnosis Date  . Anxiety and depression     Past Surgical History:  Procedure Laterality Date  . CHOLECYSTECTOMY      Family Psychiatric History: sister : anxiety Mom possible depression  Family History:  Family History  Problem Relation Age of Onset  . Rheum arthritis Mother   . Cancer Father   . Anxiety disorder Sister     Social History:   Social History   Socioeconomic History  . Marital status: Single    Spouse name: Not on file  . Number of children: Not on file  . Years of education: Not on file  . Highest education level: Not on file  Occupational History  . Not on file  Tobacco Use  . Smoking status: Never Smoker  . Smokeless tobacco: Never Used  Vaping Use  . Vaping Use: Every day  . Substances: CBD, Flavoring  Substance and Sexual Activity  . Alcohol use: Yes    Alcohol/week: 1.0 standard drink    Types: 1 Glasses of wine per week    Comment: 1 every few days  . Drug use: Not on file  . Sexual activity: Not on file  Other Topics Concern  . Not on file  Social History Narrative  . Not on file   Social Determinants of Health   Financial Resource Strain:   . Difficulty of Paying Living Expenses: Not on file  Food Insecurity:   . Worried  About Running Out of Food in the Last Year: Not on file  . Ran Out of Food in the Last Year: Not on file  Transportation Needs:   . Lack of Transportation (Medical): Not on file  . Lack of Transportation (Non-Medical): Not on file  Physical Activity:   . Days of Exercise per Week: Not on file  . Minutes of Exercise per Session: Not on file  Stress:   . Feeling of Stress : Not on file  Social Connections:   . Frequency of Communication with Friends and Family: Not on file  . Frequency of Social Gatherings with Friends and Family: Not on  file  . Attends Religious Services: Not on file  . Active Member of Clubs or Organizations: Not on file  . Attends Banker Meetings: Not on file  . Marital Status: Not on file     Works as a Comptroller  Allergies:  No Known Allergies  Metabolic Disorder Labs: No results found for: HGBA1C, MPG No results found for: PROLACTIN No results found for: CHOL, TRIG, HDL, CHOLHDL, VLDL, LDLCALC   Current Medications: Current Outpatient Medications  Medication Sig Dispense Refill  . buPROPion (WELLBUTRIN SR) 200 MG 12 hr tablet Take 1 tablet (200 mg total) by mouth daily. 30 tablet 2  . escitalopram (LEXAPRO) 10 MG tablet Take 1.5 tablets (15 mg total) by mouth daily. 45 tablet 0  . HYDROcodone-acetaminophen (NORCO/VICODIN) 5-325 MG tablet Take one by mouth at bedtime as needed for pain 5 tablet 0  . meloxicam (MOBIC) 15 MG tablet Take 1 tablet (15 mg total) by mouth daily. Take with food each morning (Patient not taking: Reported on 12/04/2017) 15 tablet 0  . norethindrone-ethinyl estradiol (JUNEL FE,GILDESS FE,LOESTRIN FE) 1-20 MG-MCG tablet Take 1 tablet by mouth daily.    . ondansetron (ZOFRAN ODT) 4 MG disintegrating tablet Take 1 tablet (4 mg total) by mouth every 8 (eight) hours as needed for nausea or vomiting. Dissolve under tongue 12 tablet 0  . SPRINTEC 28 0.25-35 MG-MCG tablet Take 1 tablet by mouth daily.  4   No current facility-administered medications for this visit.      Psychiatric Specialty Exam: Review of Systems  Cardiovascular: Negative for chest pain.  Skin: Negative for rash.  Psychiatric/Behavioral: Negative for substance abuse.    There were no vitals taken for this visit.There is no height or weight on file to calculate BMI.  General Appearance:   Eye Contact:  Speech:  Normal Rate  Volume:  Decreased  Mood:somewhat subdued  Affect:  Congruent  Thought Process:  Goal Directed  Orientation:  Full (Time, Place, and Person)  Thought  Content:  Logical and Rumination  Suicidal Thoughts:  No  Homicidal Thoughts:  No  Memory:  Immediate;   Fair Recent;   Fair  Judgement:  Fair  Insight:  Shallow  Psychomotor Activity:  Decreased  Concentration:  Concentration: Fair and Attention Span: Fair  Recall:  [employer] of Knowledge:Good  Language: Good  Akathisia:  No  Handed:  Right  AIMS (if indicated):    Assets:  Desire for Improvement  ADL's:  Intact  Cognition: WNL  Sleep:  variable    Treatment Plan Summary: Medication management and Plan as follows  MDD, mild to moderate recurrent;  Somewhat subdued, feels meds dose need not increased, will continue lexapro 15mg  Have scheduled therapy Discussed to add activities to look forward to or to distract  GAD: worries fluctuate, continue lexapro.  Continue  to work on limiting worry time and coping skill therapy, or cut down work hours if too stressful  Fu : she feels comfortable for 68m med review follow up Non face to face time spent .     [name], MD 10/19/20213:44 PM

## 2019-12-08 ENCOUNTER — Telehealth (HOSPITAL_COMMUNITY): Payer: PRIVATE HEALTH INSURANCE | Admitting: Psychiatry

## 2019-12-13 ENCOUNTER — Ambulatory Visit (INDEPENDENT_AMBULATORY_CARE_PROVIDER_SITE_OTHER): Payer: 59 | Admitting: Licensed Clinical Social Worker

## 2019-12-13 DIAGNOSIS — F331 Major depressive disorder, recurrent, moderate: Secondary | ICD-10-CM

## 2019-12-13 DIAGNOSIS — F411 Generalized anxiety disorder: Secondary | ICD-10-CM
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
## 2020-01-04 ENCOUNTER — Ambulatory Visit (HOSPITAL_COMMUNITY): Payer: 59 | Admitting: Licensed Clinical Social Worker

## 2020-02-10 ENCOUNTER — Ambulatory Visit (HOSPITAL_COMMUNITY): Payer: 59 | Admitting: Licensed Clinical Social Worker

## 2020-02-22 ENCOUNTER — Telehealth (HOSPITAL_COMMUNITY): Payer: 59 | Admitting: Psychiatry

## 2020-02-28 ENCOUNTER — Telehealth (INDEPENDENT_AMBULATORY_CARE_PROVIDER_SITE_OTHER): Payer: 59 | Admitting: Psychiatry

## 2020-02-28 ENCOUNTER — Encounter (HOSPITAL_COMMUNITY): Payer: Self-pay | Admitting: Psychiatry

## 2020-02-28 DIAGNOSIS — F411 Generalized anxiety disorder: Secondary | ICD-10-CM | POA: Diagnosis not present

## 2020-02-28 DIAGNOSIS — F331 Major depressive disorder, recurrent, moderate: Secondary | ICD-10-CM | POA: Diagnosis not present
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
## 2020-03-30 ENCOUNTER — Ambulatory Visit (HOSPITAL_COMMUNITY): Payer: 59 | Admitting: Licensed Clinical Social Worker

## 2020-05-22 ENCOUNTER — Ambulatory Visit (HOSPITAL_COMMUNITY): Payer: 59 | Admitting: Licensed Clinical Social Worker

## 2020-06-26 ENCOUNTER — Telehealth (HOSPITAL_COMMUNITY): Payer: 59 | Admitting: Psychiatry

## 2020-06-27 ENCOUNTER — Ambulatory Visit (INDEPENDENT_AMBULATORY_CARE_PROVIDER_SITE_OTHER): Payer: 59 | Admitting: Licensed Clinical Social Worker

## 2020-06-27 DIAGNOSIS — F411 Generalized anxiety disorder: Secondary | ICD-10-CM | POA: Diagnosis not present

## 2020-06-27 DIAGNOSIS — F331 Major depressive disorder, recurrent, moderate: Secondary | ICD-10-CM
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
## 2020-08-02 ENCOUNTER — Ambulatory Visit (HOSPITAL_COMMUNITY): Payer: 59 | Admitting: Licensed Clinical Social Worker

## 2020-08-24 ENCOUNTER — Ambulatory Visit: Payer: Self-pay

## 2020-08-24 ENCOUNTER — Telehealth: Payer: Self-pay | Admitting: Emergency Medicine

## 2020-08-24 NOTE — Telephone Encounter (Signed)
Call to see how Wendy Meyers was this am - RN confirmed DOB - pt stated she was feeling better and said she did not think she would need IV fluids and was not going top come in today. RN reviewed fever reduction meds & hydration guidelines w/ Wendy Meyers who verbalized an understanding - no other questions at this time

## 2020-09-01 ENCOUNTER — Ambulatory Visit (HOSPITAL_COMMUNITY): Payer: Self-pay | Admitting: Licensed Clinical Social Worker

## 2020-09-28 ENCOUNTER — Ambulatory Visit (HOSPITAL_COMMUNITY): Payer: 59 | Admitting: Licensed Clinical Social Worker

## 2020-09-28 DIAGNOSIS — F411 Generalized anxiety disorder: Secondary | ICD-10-CM

## 2020-09-28 DIAGNOSIS — F331 Major depressive disorder, recurrent, moderate: Secondary | ICD-10-CM

## 2020-09-28 NOTE — Progress Notes (Signed)
Summary: Wendy Meyers is a 30 y.o. female who presents with throwing up all night. Really nauseous right now so would like to reschedule.  Continue with therapist there is a lot going on and she will call office to set up an appointment.    Coolidge Breeze, LCSW 09/28/2020

## 2021-07-19 ENCOUNTER — Emergency Department (INDEPENDENT_AMBULATORY_CARE_PROVIDER_SITE_OTHER): Payer: 59

## 2021-07-19 ENCOUNTER — Emergency Department
Admission: EM | Admit: 2021-07-19 | Discharge: 2021-07-19 | Disposition: A | Discharge status: Home / Self Care | Payer: 59 | Source: Home / Self Care | Attending: Family Medicine | Admitting: Family Medicine

## 2021-07-19 ENCOUNTER — Emergency Department: Admit: 2021-07-19 | Discharge status: Absent without leave | Payer: Self-pay

## 2021-07-19 DIAGNOSIS — S6992XA Unspecified injury of left wrist, hand and finger(s), initial encounter: Secondary | ICD-10-CM

## 2021-07-19 DIAGNOSIS — S6710XA Crushing injury of unspecified finger(s), initial encounter: Secondary | ICD-10-CM

## 2021-07-19 HISTORY — DX: Migraine, unspecified, not intractable, without status migrainosus: G43.909

## 2021-07-19 MED ORDER — ONDANSETRON 4 MG PO TBDP: 4.0000 mg | ORAL_TABLET | Freq: Once | ORAL | Status: DC

## 2021-07-19 MED ORDER — ACETAMINOPHEN 325 MG PO TABS
650.0000 mg | ORAL_TABLET | Freq: Once | ORAL | Status: AC
  Administered 2021-07-19: 650 mg via ORAL

## 2021-07-19 MED ORDER — HYDROCODONE-ACETAMINOPHEN 5-325 MG PO TABS: 1.0000 | ORAL_TABLET | Freq: Four times a day (QID) | ORAL | 0 refills | Status: DC | PRN

## 2021-07-19 NOTE — ED Triage Notes (Signed)
Pt presents with left thumb injury after "slamming it in a door" today. Pt states she removed the remainder of her fingernail at the time of injury. Last Tdap last September.

## 2021-07-19 NOTE — Discharge Instructions (Signed)
May take over-the-counter medicines for moderate pain Take hydrocodone as needed for severe pain Elevate to reduce pain and swelling May put ice on the outside of the dressing for swelling and pain Change the dressing daily and watch for infection Call for problems

## 2021-07-19 NOTE — ED Provider Notes (Signed)
[name]    CSN: [id number] Arrival date & time: 07/19/21  1447      History   Chief Complaint Chief Complaint  Patient presents with   Finger Injury    HPI Wendy Meyers is a 31 y.o. female.   HPI  Patient accidentally slammed her thumb in a door as she was trying to let her little dog in and out.  She states that the tip of her thumb got squashed in the door and the tip of the thumb nail was ripped off.  It is very painful.  She is concerned about fracture.  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      Home Medications    Prior to Admission medications   Medication Sig Start Date End Date Taking? Authorizing Provider  HYDROcodone-acetaminophen (NORCO/VICODIN) 5-325 MG tablet Take 1-2 tablets by mouth every 6 (six) hours as needed. 07/19/21  Yes Eustace Moore, MD  Topiramate (TOPAMAX PO) Take by mouth.   Yes [provider]  buPROPion (WELLBUTRIN SR) 200 MG 12 hr tablet Take 1 tablet (200 mg total) by mouth daily. 09/07/19   [name], MD  escitalopram (LEXAPRO) 10 MG tablet Take 1.5 tablets (15 mg total) by mouth daily. 11/23/19   [name], MD  norethindrone-ethinyl estradiol (JUNEL FE,GILDESS FE,LOESTRIN FE) 1-20 MG-MCG tablet Take 1 tablet by mouth daily.    [provider]    Family History Family History  Problem Relation Age of Onset   Rheum arthritis Mother    Cancer Father    Anxiety disorder Sister     Social History Social History   Tobacco Use   Smoking status: Never   Smokeless tobacco: Never  Vaping Use   Vaping Use: Every day   Substances: CBD, Flavoring  Substance Use Topics   Alcohol  use: Yes    Alcohol/week: 1.0 standard drink of alcohol    Types: 1 Glasses of wine per week    Comment: 1 every few days     Allergies   Patient has no known allergies.   Review of Systems Review of Systems See HPI  Physical Exam Triage Vital Signs ED Triage Vitals  Enc Vitals Group     BP 07/19/21 1457 109/75     Pulse Rate 07/19/21 1457 90     Resp 07/19/21 1457 14     Temp 07/19/21 1457 100 F (37.8 C)     Temp Source 07/19/21 1457 Oral     SpO2 07/19/21 1457 96 %     Weight 07/19/21 1458 199 lb (90.3 kg)     Height --      Head Circumference --      Peak Flow --      Pain Score 07/19/21 1458 8     Pain Loc --      Pain Edu? --      Excl. in [place]? --    No data found.  Updated Vital Signs BP 109/75 (BP Location: Left Arm)   Pulse 90   Temp 100 F (37.8 C) (Oral)   Resp 14   Wt  90.3 kg   SpO2 96%   BMI 34.16 kg/m      Physical Exam Constitutional:      General: She is not in acute distress.    Appearance: She is well-developed.  HENT:     Head: Normocephalic and atraumatic.  Eyes:     Conjunctiva/sclera: Conjunctivae normal.     Pupils: Pupils are equal, round, and reactive to light.  Cardiovascular:     Rate and Rhythm: Normal rate.  Pulmonary:     Effort: Pulmonary effort is normal. No respiratory distress.  Abdominal:     General: There is no distension.     Palpations: Abdomen is soft.  Musculoskeletal:        General: Normal range of motion.     Cervical back: Normal range of motion.     Comments: Approximately 50% of the distal nail is avulsed, removed.  Remaining nail appears to be well adhered.  The area is quite tender.  Bleeding is been controlled with pressure.  Sensations normal the tip  Skin:    General: Skin is warm and dry.  Neurological:     General: No focal deficit present.     Mental Status: She is alert.  Psychiatric:        Mood and Affect: Mood normal.        Behavior: Behavior normal.      UC Treatments / Results   Labs (all labs ordered are listed, but only abnormal results are displayed) Labs Reviewed - No data to display  EKG   Radiology DG Finger Thumb Left  Result Date: 07/19/2021 CLINICAL DATA:  Left thumb injury, slammed in door today. EXAM: LEFT THUMB 2+V COMPARISON:  None Available. FINDINGS: There is no evidence of fracture or dislocation. Normal alignment and joint spaces. There is no evidence of arthropathy or other focal bone abnormality. Soft tissues are unremarkable. No soft tissue gas or radiopaque foreign body. IMPRESSION: Negative radiographs of the left thumb. Electronically Signed   By: Narda Rutherford M.D.   On: 07/19/2021 15:20    Procedures Procedures (including critical care time)  Medications Ordered in UC Medications  acetaminophen (TYLENOL) tablet 650 mg (650 mg Oral Given 07/19/21 1520)    Initial Impression / Assessment and Plan / UC Course  I have reviewed the triage vital signs and the nursing notes.  Pertinent labs & imaging results that were available during my care of the patient were reviewed by me and considered in my medical decision making (see chart for details).     Discussed wound care while nail grows out Final Clinical Impressions(s) / UC Diagnoses   Final diagnoses:  Crushing injury of finger, initial encounter  Injury to fingernail of left hand, initial encounter     Discharge Instructions      May take over-the-counter medicines for moderate pain Take hydrocodone as needed for severe pain Elevate to reduce pain and swelling May put ice on the outside of the dressing for swelling and pain Change the dressing daily and watch for infection Call for problems   ED Prescriptions     Medication Sig Dispense Auth. Provider   HYDROcodone-acetaminophen (NORCO/VICODIN) 5-325 MG tablet Take 1-2 tablets by mouth every 6 (six) hours as needed. 10 tablet Eustace Moore, MD      I have reviewed the PDMP during this encounter.   Eustace Moore, MD 07/19/21 1544

## 2022-05-06 ENCOUNTER — Ambulatory Visit (INDEPENDENT_AMBULATORY_CARE_PROVIDER_SITE_OTHER): Payer: 59

## 2022-05-06 ENCOUNTER — Ambulatory Visit
Admission: RE | Admit: 2022-05-06 | Discharge: 2022-05-06 | Disposition: A | Discharge status: Home / Self Care | Payer: 59 | Source: Ambulatory Visit | Attending: Family Medicine | Admitting: Family Medicine

## 2022-05-06 VITALS — BP 127/78 | HR 100 | Temp 98.7°F | Resp 18

## 2022-05-06 DIAGNOSIS — R1012 Left upper quadrant pain: Secondary | ICD-10-CM

## 2022-05-06 DIAGNOSIS — R110 Nausea: Secondary | ICD-10-CM

## 2022-05-06 DIAGNOSIS — R519 Headache, unspecified: Secondary | ICD-10-CM | POA: Diagnosis not present

## 2022-05-06 LAB — POCT URINE PREGNANCY: Preg Test, Ur: NEGATIVE

## 2022-05-06 LAB — POCT URINALYSIS DIP (MANUAL ENTRY)
Bilirubin, UA: NEGATIVE
Blood, UA: NEGATIVE
Glucose, UA: NEGATIVE mg/dL
Ketones, POC UA: NEGATIVE mg/dL
Leukocytes, UA: NEGATIVE
Nitrite, UA: NEGATIVE
Protein Ur, POC: NEGATIVE mg/dL
Spec Grav, UA: 1.025 (ref 1.010–1.025)
Urobilinogen, UA: 0.2 E.U./dL
pH, UA: 6.5 (ref 5.0–8.0)

## 2022-05-06 MED ORDER — IOHEXOL 300 MG/ML  SOLN
100.0000 mL | Freq: Once | INTRAMUSCULAR | Status: AC | PRN
Start: 2022-05-06 — End: 2022-05-06
  Administered 2022-05-06: 100 mL via INTRAVENOUS

## 2022-05-06 MED ORDER — ONDANSETRON 8 MG PO TBDP: 8.0000 mg | ORAL_TABLET | Freq: Three times a day (TID) | ORAL | 0 refills | Status: DC | PRN

## 2022-05-06 MED ORDER — ONDANSETRON 8 MG PO TBDP
8.0000 mg | ORAL_TABLET | Freq: Once | ORAL | Status: AC
  Administered 2022-05-06: 8 mg via ORAL

## 2022-05-06 NOTE — Discharge Instructions (Addendum)
Advised patient of CT of abdomen pelvis with contrast results with hardcopy provided to patient.  Advised may use Zofran daily or as needed for nausea.  Advised patient if left upper quadrant abdominal pain persists please follow-up with PCP or GI for further evaluation.

## 2022-05-06 NOTE — ED Provider Notes (Signed)
[facility]    CSN: [id number] Arrival date & time: 05/06/22  1556      History   Chief Complaint Chief Complaint  Patient presents with   Abdominal Pain    I have had abdominal pain that goes between moderate to severe. It has me doubled over in pain when it gets severe. If I eat or drink it gets worse. If I lay down on a certain side it is better. Bowel movements do not help. - Entered by patient    HPI Wendy Meyers is a 32 y.o. female.   HPI 32 year old female presents with abdominal pain which she reports moderate to severe 4 days.  Reports doubles over in pain when abdominal pain is severe.  Reports abdominal pain is less severe when lying on her left side.  Additionally, patient reports bowel movements do not help.  PMH significant for obesity, anxiety/depression and migraines.  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      Home Medications    Prior to Admission medications   Medication Sig Start Date End Date Taking? Authorizing Provider  ondansetron (ZOFRAN-ODT) 8 MG disintegrating tablet Take 1 tablet (8 mg total) by mouth every 8 (eight) hours as needed for nausea or vomiting. 05/06/22  Yes Eliezer Lofts, FNP  buPROPion (WELLBUTRIN SR) 200 MG 12 hr tablet Take 1 tablet (200 mg total) by mouth daily. 09/07/19   Merian Capron, MD  escitalopram (LEXAPRO) 10 MG tablet Take 1.5 tablets (15 mg total) by mouth daily. 11/23/19   Merian Capron, MD  HYDROcodone-acetaminophen (NORCO/VICODIN) 5-325 MG tablet Take 1-2 tablets by mouth every 6 (six) hours as needed. 07/19/21   Raylene Everts, MD  norethindrone-ethinyl estradiol (JUNEL FE,GILDESS FE,LOESTRIN FE) 1-20 MG-MCG tablet Take 1  tablet by mouth daily.    [provider]  Topiramate (TOPAMAX PO) Take by mouth.    [provider]    Family History Family History  Problem Relation Age of Onset   Rheum arthritis Mother    Cancer Father    Anxiety disorder Sister     Social History Social History   Tobacco Use   Smoking status: Never   Smokeless tobacco: Never  Vaping Use   Vaping Use: Every day   Substances: CBD, Flavoring  Substance Use Topics   Alcohol use: Yes    Alcohol/week: 1.0 standard drink of alcohol    Types: 1 Glasses of wine per week    Comment: 1 every few days     Allergies   Patient has no known allergies.   Review of Systems Review of Systems  Gastrointestinal:  Positive for abdominal pain.  All other systems reviewed and are negative.    Physical Exam Triage Vital Signs ED Triage Vitals  Enc Vitals Group     BP      Pulse      Resp      Temp      Temp src      SpO2      Weight      Height      Head Circumference      Peak Flow  Pain Score      Pain Loc      Pain Edu?      Excl. in [place]?    No data found.  Updated Vital Signs BP 127/78 (BP Location: Right Arm)   Pulse 100   Temp 98.7 F (37.1 C) (Oral)   Resp 18   LMP 04/10/2022 (Exact Date)   SpO2 97%      Physical Exam Vitals and nursing note reviewed.  Constitutional:      Appearance: She is well-developed. She is obese.  HENT:     Head: Normocephalic and atraumatic.     Mouth/Throat:     Mouth: Mucous membranes are moist.     Pharynx: Oropharynx is clear.  Eyes:     Extraocular Movements: Extraocular movements intact.     Pupils: Pupils are equal, round, and reactive to light.  Cardiovascular:     Rate and Rhythm: Normal rate and regular rhythm.     Heart sounds: Normal heart sounds. No murmur heard. Pulmonary:     Effort: Pulmonary effort is normal.     Breath sounds: Normal breath sounds. No wheezing, rhonchi or rales.  Chest:     Chest wall: No tenderness.   Abdominal:     General: Abdomen is flat. Bowel sounds are decreased. There is no distension or abdominal bruit.     Palpations: Abdomen is soft. There is no shifting dullness, fluid wave, hepatomegaly, mass or pulsatile mass.     Tenderness: There is abdominal tenderness in the left upper quadrant. There is no right CVA tenderness, left CVA tenderness, guarding or rebound. Negative signs include Murphy's sign, Rovsing's sign, McBurney's sign and psoas sign.  Skin:    General: Skin is warm and dry.  Neurological:     Mental Status: She is alert and oriented to person, place, and time.  Psychiatric:        Mood and Affect: Mood normal.        Behavior: Behavior normal.      UC Treatments / Results  Labs (all labs ordered are listed, but only abnormal results are displayed) Labs Reviewed  POCT URINALYSIS DIP (MANUAL ENTRY)  POCT URINE PREGNANCY    EKG   Radiology CT ABDOMEN PELVIS W CONTRAST  Result Date: 05/06/2022 CLINICAL DATA:  Left upper quadrant abdominal pain, nausea, headache since yesterday EXAM: CT ABDOMEN AND PELVIS WITH CONTRAST TECHNIQUE: Multidetector CT imaging of the abdomen and pelvis was performed using the standard protocol following bolus administration of intravenous contrast. RADIATION DOSE REDUCTION: This exam was performed according to the departmental dose-optimization program which includes automated exposure control, adjustment of the mA and/or kV according to patient size and/or use of iterative reconstruction technique. CONTRAST:  11mL OMNIPAQUE IOHEXOL 300 MG/ML  SOLN COMPARISON:  05/12/2007 FINDINGS: Lower chest: No acute pleural or parenchymal lung disease. Hepatobiliary: No focal liver abnormality is seen. Status post cholecystectomy. No biliary dilatation. Pancreas: Unremarkable. No pancreatic ductal dilatation or surrounding inflammatory changes. Spleen: Normal in size without focal abnormality. Adrenals/Urinary Tract: Adrenal glands are unremarkable.  Kidneys are normal, without renal calculi, focal lesion, or hydronephrosis. Bladder is decompressed, limiting its evaluation. Stomach/Bowel: No bowel obstruction or ileus. Normal appendix right lower quadrant. No bowel wall thickening or inflammatory change. Vascular/Lymphatic: No significant vascular findings are present. No enlarged abdominal or pelvic lymph nodes. Reproductive: Uterus and bilateral adnexa are unremarkable. Other: No free fluid or free intraperitoneal gas. No abdominal wall hernia. Musculoskeletal: No acute or destructive bony lesions. Reconstructed images  demonstrate no additional findings. IMPRESSION: 1. No acute intra-abdominal or intrapelvic process. Electronically Signed   By: Randa Ngo M.D.   On: 05/06/2022 17:00    Procedures Procedures (including critical care time)  Medications Ordered in UC Medications  ondansetron (ZOFRAN-ODT) disintegrating tablet 8 mg (8 mg Oral Given 05/06/22 1617)    Initial Impression / Assessment and Plan / UC Course  I have reviewed the triage vital signs and the nursing notes.  Pertinent labs & imaging results that were available during my care of the patient were reviewed by me and considered in my medical decision making (see chart for details).     MDM: 1.  Abdominal pain, left upper quadrant-CT of abdomen and pelvis with contrast reveals above, no acute intra-abdominal or intrapelvic process.  UA, unremarkable urine pregnancy negative; 2.  Nausea-Zofran 8 mg given once and clinic and prior to discharge, Rx'd Zofran 8 mg 3 times daily, as needed for nausea. Advised patient of CT of abdomen pelvis with contrast results with hardcopy provided to patient.  Advised may use Zofran daily or as needed for nausea.  Advised patient if left upper quadrant abdominal pain persists please follow-up with PCP or GI for further evaluation.  Discharged home, hemodynamically stable. Final Clinical Impressions(s) / UC Diagnoses   Final diagnoses:   Abdominal pain, left upper quadrant  Nausea     Discharge Instructions      Advised patient of CT of abdomen pelvis with contrast results with hardcopy provided to patient.  Advised may use Zofran daily or as needed for nausea.  Advised patient if left upper quadrant abdominal pain persists please follow-up with PCP or GI for further evaluation.     ED Prescriptions     Medication Sig Dispense Auth. Provider   ondansetron (ZOFRAN-ODT) 8 MG disintegrating tablet Take 1 tablet (8 mg total) by mouth every 8 (eight) hours as needed for nausea or vomiting. 24 tablet Eliezer Lofts, FNP      PDMP not reviewed this encounter.   Eliezer Lofts, [place] 05/06/22 1746

## 2022-05-06 NOTE — ED Triage Notes (Addendum)
Pt reports LUQ abdominal pain, nausea and a headache since yesterday. States eating, drinking, walking and moving around worsens the pain. Laying down helps but doesn't completely alleviate the pain. Also reports vaginal spotting. LMP was 04/10/22. Took two home pregnancy tests and they were negative. LBM today.

## 2022-11-19 ENCOUNTER — Ambulatory Visit: Payer: 59

## 2023-02-09 ENCOUNTER — Ambulatory Visit
Admission: RE | Admit: 2023-02-09 | Discharge: 2023-02-09 | Disposition: A | Discharge status: Home / Self Care | Payer: BC Managed Care – PPO | Source: Ambulatory Visit | Attending: Family Medicine | Admitting: Family Medicine

## 2023-02-09 ENCOUNTER — Other Ambulatory Visit: Payer: Self-pay

## 2023-02-09 VITALS — BP 120/89 | HR 98 | Temp 98.6°F | Resp 16

## 2023-02-09 DIAGNOSIS — R6889 Other general symptoms and signs: Secondary | ICD-10-CM | POA: Diagnosis not present

## 2023-02-09 LAB — POCT URINALYSIS DIP (MANUAL ENTRY)
Blood, UA: NEGATIVE
Glucose, UA: 250 mg/dL — AB
Nitrite, UA: POSITIVE — AB
Protein Ur, POC: >=300 mg/dL — AB
Spec Grav, UA: <=1.005 — AB (ref 1.010–1.025)
Urobilinogen, UA: >=8 U/dL — AB
pH, UA: 5 (ref 5.0–8.0)

## 2023-02-09 LAB — POCT INFLUENZA A/B
Influenza A, POC: NEGATIVE
Influenza B, POC: NEGATIVE

## 2023-02-09 MED ORDER — DIPHENOXYLATE-ATROPINE 2.5-0.025 MG PO TABS: 1.0000 | ORAL_TABLET | Freq: Four times a day (QID) | ORAL | 0 refills | Status: AC | PRN

## 2023-02-09 NOTE — Discharge Instructions (Signed)
 Drink lots of fluids Take Lomotil for the diarrhea May use over the counter cough and cold medications Tylenol or ibuprofen for the fever and bodyaches See your doctor if not improving by next week

## 2023-02-09 NOTE — ED Triage Notes (Addendum)
 C/o diarrhea since Friday, this morning started with abdominal pain. Reports fever, feeling like urinating after urination, chest congestion. Fever to 100.7. has taken tylenol, immodium, peptobismol, mucinex, pyridium.

## 2023-02-09 NOTE — ED Provider Notes (Signed)
 [facility]    CSN: [id number] Arrival date & time: 02/09/23  9095      History   Chief Complaint Chief Complaint  Patient presents with   Diarrhea    HPI Wendy Meyers is a 33 y.o. female.   HPI  [name] has a flulike virus with nausea vomiting diarrhea and abdominal cramping, cold symptoms cough body aches and fatigue.  This has been going on for 2 days.  She has tried Imodium with no improvement.  She is feeling very tired.  Feels she is having trouble keeping her fluids up.  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      Home Medications    Prior to Admission medications   Medication Sig Start Date End Date Taking? Authorizing Provider  desvenlafaxine (PRISTIQ) 50 MG 24 hr tablet Take 50 mg by mouth daily.   Yes [provider]  diphenoxylate -atropine  (LOMOTIL ) 2.5-0.025 MG tablet Take 1 tablet by mouth 4 (four) times daily as needed for diarrhea or loose stools. 02/09/23  Yes Maranda Jamee Jacob, MD  buPROPion  (WELLBUTRIN  SR) 200 MG 12 hr tablet Take 1 tablet (200 mg total) by mouth daily. 09/07/19   Geralene Kaiser, MD    Family History Family History  Problem Relation Age of Onset   Rheum arthritis Mother    Cancer Father    Anxiety disorder Sister     Social History Social History   Tobacco Use   Smoking status: Never   Smokeless tobacco: Never  Vaping Use   Vaping status: Every Day   Substances: CBD, Flavoring  Substance Use Topics   Alcohol use: Yes    Alcohol/week: 1.0 standard drink of alcohol    Types: 1 Glasses of wine per week    Comment: 1 every few days     Allergies   Patient has no known allergies.   Review of Systems Review of  Systems See HPI  Physical Exam Triage Vital Signs ED Triage Vitals  Encounter Vitals Group     BP 02/09/23 0922 120/89     Systolic BP Percentile --      Diastolic BP Percentile --      Pulse Rate 02/09/23 0922 98     Resp 02/09/23 0922 16     Temp 02/09/23 0922 98.6 F (37 C)     Temp Source 02/09/23 0922 Oral     SpO2 02/09/23 0922 98 %     Weight --      Height --      Head Circumference --      Peak Flow --      Pain Score 02/09/23 0926 6     Pain Loc --      Pain Education --      Exclude from Growth Chart --    No data found.  Updated Vital Signs BP 120/89   Pulse 98   Temp 98.6 F (37 C) (Oral)   Resp 16   LMP 01/20/2023 (Exact Date)   SpO2 98%       Physical Exam Constitutional:      General: She is not in acute distress.  Appearance: She is well-developed. She is obese. She is ill-appearing.  HENT:     Head: Normocephalic and atraumatic.     Mouth/Throat:     Mouth: Mucous membranes are moist.     Pharynx: No posterior oropharyngeal erythema.  Eyes:     Conjunctiva/sclera: Conjunctivae normal.     Pupils: Pupils are equal, round, and reactive to light.  Cardiovascular:     Rate and Rhythm: Normal rate.     Heart sounds: Normal heart sounds.  Pulmonary:     Effort: Pulmonary effort is normal. No respiratory distress.     Breath sounds: Normal breath sounds.  Abdominal:     General: There is no distension.     Palpations: Abdomen is soft.     Tenderness: There is no abdominal tenderness.  Musculoskeletal:        General: Normal range of motion.     Cervical back: Normal range of motion.  Skin:    General: Skin is warm and dry.  Neurological:     Mental Status: She is alert.      UC Treatments / Results  Labs (all labs ordered are listed, but only abnormal results are displayed) Labs Reviewed  POCT URINALYSIS DIP (MANUAL ENTRY) - Abnormal; Notable for the following components:      Result Value   Color, UA orange (*)    Glucose, UA  =250 (*)    Bilirubin, UA large (*)    Ketones, POC UA small (15) (*)    Spec Grav, UA <=1.005 (*)    Protein Ur, POC >=300 (*)    Urobilinogen, UA >=8.0 (*)    Nitrite, UA Positive (*)    Leukocytes, UA Large (3+) (*)    All other components within normal limits  POCT INFLUENZA A/B - Normal    EKG   Radiology No results found.  Procedures Procedures (including critical care time)  Medications Ordered in UC Medications - No data to display  Initial Impression / Assessment and Plan / UC Course  I have reviewed the triage vital signs and the nursing notes.  Pertinent labs & imaging results that were available during my care of the patient were reviewed by me and considered in my medical decision making (see chart for details).     Influenza test is negative Urinalysis is abnormal due to the Pyridium use.  Will send culture Final Clinical Impressions(s) / UC Diagnoses   Final diagnoses:  Flu-like symptoms     Discharge Instructions      Drink lots of fluids Take Lomotil  for the diarrhea May use over the counter cough and cold medications Tylenol  or ibuprofen for the fever and bodyaches See your doctor if not improving by next week   ED Prescriptions     Medication Sig Dispense Auth. Provider   diphenoxylate -atropine  (LOMOTIL ) 2.5-0.025 MG tablet Take 1 tablet by mouth 4 (four) times daily as needed for diarrhea or loose stools. 15 tablet Maranda Jamee Jacob, MD      I have reviewed the PDMP during this encounter.   Maranda Jamee Jacob, MD 02/09/23 [phone]

## 2023-02-09 NOTE — ED Notes (Signed)
 Wrong POC SARS/Flu test ordered.  Correct one done and resulted.

## 2023-02-11 ENCOUNTER — Telehealth: Payer: Self-pay

## 2023-02-11 MED ORDER — CEFDINIR 300 MG PO CAPS: 300.0000 mg | ORAL_CAPSULE | Freq: Two times a day (BID) | ORAL | 0 refills | Status: AC

## 2023-07-18 ENCOUNTER — Ambulatory Visit
Admission: RE | Admit: 2023-07-18 | Discharge: 2023-07-18 | Disposition: A | Discharge status: Home / Self Care | Source: Ambulatory Visit | Attending: Family Medicine | Admitting: Family Medicine

## 2023-07-18 VITALS — BP 112/71 | HR 81 | Temp 99.7°F | Resp 16

## 2023-07-18 DIAGNOSIS — R300 Dysuria: Secondary | ICD-10-CM | POA: Diagnosis present

## 2023-07-18 DIAGNOSIS — Z113 Encounter for screening for infections with a predominantly sexual mode of transmission: Secondary | ICD-10-CM | POA: Diagnosis present

## 2023-07-18 DIAGNOSIS — Z79899 Other long term (current) drug therapy: Secondary | ICD-10-CM | POA: Insufficient documentation

## 2023-07-18 DIAGNOSIS — N898 Other specified noninflammatory disorders of vagina: Secondary | ICD-10-CM | POA: Diagnosis present

## 2023-07-18 DIAGNOSIS — F419 Anxiety disorder, unspecified: Secondary | ICD-10-CM | POA: Diagnosis not present

## 2023-07-18 DIAGNOSIS — F32A Depression, unspecified: Secondary | ICD-10-CM | POA: Insufficient documentation

## 2023-07-18 DIAGNOSIS — N3000 Acute cystitis without hematuria: Secondary | ICD-10-CM | POA: Insufficient documentation

## 2023-07-18 LAB — POCT URINALYSIS DIP (MANUAL ENTRY)
Bilirubin, UA: NEGATIVE
Blood, UA: NEGATIVE
Glucose, UA: 100 mg/dL — AB
Ketones, POC UA: NEGATIVE mg/dL
Nitrite, UA: POSITIVE — AB
Protein Ur, POC: NEGATIVE mg/dL
Spec Grav, UA: 1.02 (ref 1.010–1.025)
Urobilinogen, UA: 0.2 U/dL
pH, UA: 5.5 (ref 5.0–8.0)

## 2023-07-18 MED ORDER — FLUCONAZOLE 200 MG PO TABS: ORAL_TABLET | ORAL | 0 refills | Status: DC

## 2023-07-18 MED ORDER — CEFDINIR 300 MG PO CAPS: 300.0000 mg | ORAL_CAPSULE | Freq: Two times a day (BID) | ORAL | 0 refills | Status: AC

## 2023-07-18 NOTE — ED Triage Notes (Signed)
 Pt presents to uc with dysuria since 12 today and concern for bv as well. She has taken otc azo

## 2023-07-18 NOTE — Discharge Instructions (Addendum)
 Advised patient to take medications as directed with food to completion.  Encouraged to increase daily water intake to 64 ounces per day while taking these medications.  Advised we will follow-up with urine culture and Aptima swab results once received.  Advised if symptoms worsen and/or unresolved please follow-up with your PCP or here for further evaluation.

## 2023-07-18 NOTE — ED Provider Notes (Signed)
 [facility]    CSN: [id number] Arrival date & time: 07/18/23  1408      History   Chief Complaint Chief Complaint  Patient presents with   Urinary Frequency   Vaginitis    HPI Wendy Meyers is a 33 y.o. female.   HPI 33 year old female presents with dysuria that began earlier today.  Patient is concerned with BV as well.  PMH significant for morbid obesity, migraines, anxiety and depression.  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      Home Medications    Prior to Admission medications   Medication Sig Start Date End Date Taking? Authorizing Provider  cefdinir  (OMNICEF ) 300 MG capsule Take 1 capsule (300 mg total) by mouth 2 (two) times daily for 7 days. 07/18/23 07/25/23 Yes Leonides Ramp, FNP  fluconazole (DIFLUCAN) 200 MG tablet Take 1 tab p.o. now, may repeat 1 tab p.o. in 3 days if symptoms are not resolved. 07/18/23  Yes Leonides Ramp, FNP  ondansetron  (ZOFRAN -ODT) 4 MG disintegrating tablet Take 4 mg by mouth. 07/16/23 07/26/23 Yes [provider]  buPROPion  (WELLBUTRIN  SR) 200 MG 12 hr tablet Take 1 tablet (200 mg total) by mouth daily. 09/07/19   Wray Heady, MD  desvenlafaxine (PRISTIQ) 50 MG 24 hr tablet Take 50 mg by mouth daily.    [provider]  diphenoxylate -atropine  (LOMOTIL ) 2.5-0.025 MG tablet Take 1 tablet by mouth 4 (four) times daily as needed for diarrhea or loose stools. 02/09/23   Stephany Ehrich, MD  methylphenidate 27 MG PO CR tablet Take 27 mg by mouth every morning.    [provider]  ZUMANDIMINE 3-0.03 MG tablet Take 1 tablet by mouth daily.    [provider]    Family History Family History  Problem  Relation Age of Onset   Rheum arthritis Mother    Cancer Father    Anxiety disorder Sister     Social History Social History   Tobacco Use   Smoking status: Never   Smokeless tobacco: Never  Vaping Use   Vaping status: Every Day   Substances: CBD, Flavoring  Substance Use Topics   Alcohol use: Yes    Alcohol/week: 1.0 standard drink of alcohol    Types: 1 Glasses of wine per week    Comment: 1 every few days     Allergies   Patient has no known allergies.   Review of Systems Review of Systems  Genitourinary:  Positive for dysuria and vaginal discharge.     Physical Exam Triage Vital Signs ED Triage Vitals  Encounter Vitals Group     BP 07/18/23 1437 112/71     Girls Systolic BP Percentile --      Girls Diastolic BP Percentile --      Boys Systolic BP Percentile --      Boys Diastolic BP Percentile --      Pulse Rate 07/18/23 1437 81     Resp 07/18/23 1437 16     Temp 07/18/23 1437 99.7 F (37.6 C)     Temp src --      SpO2 07/18/23 1437 98 %  Weight --      Height --      Head Circumference --      Peak Flow --      Pain Score 07/18/23 1435 3     Pain Loc --      Pain Education --      Exclude from Growth Chart --    No data found.  Updated Vital Signs BP 112/71   Pulse 81   Temp 99.7 F (37.6 C)   Resp 16   LMP 06/24/2023   SpO2 98%   Physical Exam Vitals and nursing note reviewed.  Constitutional:      Appearance: Normal appearance. She is obese.  HENT:     Head: Normocephalic and atraumatic.     Mouth/Throat:     Mouth: Mucous membranes are moist.     Pharynx: Oropharynx is clear.   Eyes:     Extraocular Movements: Extraocular movements intact.     Conjunctiva/sclera: Conjunctivae normal.     Pupils: Pupils are equal, round, and reactive to light.    Cardiovascular:     Rate and Rhythm: Normal rate and regular rhythm.     Pulses: Normal pulses.     Heart sounds: Normal heart sounds.  Pulmonary:     Effort: Pulmonary effort  is normal.     Breath sounds: Normal breath sounds. No wheezing, rhonchi or rales.   Musculoskeletal:        General: Normal range of motion.   Skin:    General: Skin is warm and dry.   Neurological:     General: No focal deficit present.     Mental Status: She is alert and oriented to person, place, and time. Mental status is at baseline.   Psychiatric:        Mood and Affect: Mood normal.        Behavior: Behavior normal.      UC Treatments / Results  Labs (all labs ordered are listed, but only abnormal results are displayed) Labs Reviewed  POCT URINALYSIS DIP (MANUAL ENTRY) - Abnormal; Notable for the following components:      Result Value   Color, UA orange (*)    Clarity, UA cloudy (*)    Glucose, UA =100 (*)    Nitrite, UA Positive (*)    Leukocytes, UA Trace (*)    All other components within normal limits  URINE CULTURE  CERVICOVAGINAL ANCILLARY ONLY    EKG   Radiology No results found.  Procedures Procedures (including critical care time)  Medications Ordered in UC Medications - No data to display  Initial Impression / Assessment and Plan / UC Course  I have reviewed the triage vital signs and the nursing notes.  Pertinent labs & imaging results that were available during my care of the patient were reviewed by me and considered in my medical decision making (see chart for details).     MDM: 1.  Acute cystitis without hematuria-UA revealed above, urine culture ordered, Rx'd cefdinir  300 mg capsule: Take 1 capsule twice daily x 7 days; 2.  Vaginal odor-Aptima swab ordered. Advised patient to take medications as directed with food to completion.  Encouraged to increase daily water intake to 64 ounces per day while taking these medications.  Advised we will follow-up with urine culture and Aptima swab results once received.  Advised if symptoms worsen and/or unresolved please follow-up with your PCP or here for further evaluation.  Final Clinical  Impressions(s) / UC Diagnoses  Final diagnoses:  Vaginal odor  Dysuria  Acute cystitis without hematuria     Discharge Instructions      Advised patient to take medications as directed with food to completion.  Encouraged to increase daily water intake to 64 ounces per day while taking these medications.  Advised we will follow-up with urine culture and Aptima swab results once received.  Advised if symptoms worsen and/or unresolved please follow-up with your PCP or here for further evaluation.     ED Prescriptions     Medication Sig Dispense Auth. Provider   cefdinir  (OMNICEF ) 300 MG capsule Take 1 capsule (300 mg total) by mouth 2 (two) times daily for 7 days. 14 capsule Gordon Vandunk, FNP   fluconazole (DIFLUCAN) 200 MG tablet Take 1 tab p.o. now, may repeat 1 tab p.o. in 3 days if symptoms are not resolved. 7 tablet Melaysia Streed, FNP      PDMP not reviewed this encounter.   Leonides Ramp, FNP 07/18/23 1536

## 2023-07-20 LAB — URINE CULTURE: Culture: 10000 — AB

## 2023-07-21 ENCOUNTER — Ambulatory Visit (HOSPITAL_COMMUNITY): Payer: Self-pay

## 2023-07-21 LAB — CERVICOVAGINAL ANCILLARY ONLY
Bacterial Vaginitis (gardnerella): NEGATIVE
Candida Glabrata: NEGATIVE
Candida Vaginitis: NEGATIVE
Chlamydia: NEGATIVE
Comment: NEGATIVE
Comment: NEGATIVE
Comment: NEGATIVE
Comment: NEGATIVE
Comment: NEGATIVE
Comment: NORMAL
Neisseria Gonorrhea: NEGATIVE
Trichomonas: NEGATIVE

## 2024-01-02 ENCOUNTER — Ambulatory Visit
Admission: RE | Admit: 2024-01-02 | Discharge: 2024-01-02 | Disposition: A | Discharge status: Home / Self Care | Attending: Internal Medicine | Admitting: Internal Medicine

## 2024-01-02 VITALS — BP 113/72 | HR 136 | Temp 98.2°F | Resp 17

## 2024-01-02 DIAGNOSIS — R053 Chronic cough: Secondary | ICD-10-CM

## 2024-01-02 DIAGNOSIS — J069 Acute upper respiratory infection, unspecified: Secondary | ICD-10-CM

## 2024-01-02 MED ORDER — BENZONATATE 100 MG PO CAPS: 100.0000 mg | ORAL_CAPSULE | Freq: Three times a day (TID) | ORAL | 0 refills | Status: AC | PRN

## 2024-01-02 MED ORDER — AMOXICILLIN-POT CLAVULANATE 875-125 MG PO TABS: 1.0000 | ORAL_TABLET | Freq: Two times a day (BID) | ORAL | 0 refills | Status: AC

## 2024-01-02 MED ORDER — FLUCONAZOLE 150 MG PO TABS: 150.0000 mg | ORAL_TABLET | Freq: Every day | ORAL | 0 refills | Status: AC

## 2024-01-02 NOTE — ED Provider Notes (Addendum)
 [facility]    CSN: [id number] Arrival date & time: 01/02/24  1002      History   Chief Complaint Chief Complaint  Patient presents with   Cough    X 2 weeks; 10AM APPT    HPI Wendy Meyers is a 33 y.o. female.   Patient presents with 2-week history of cough and nasal congestion.  Patient reports that she was feeling better but symptoms returned even worse yesterday with a productive cough.  Denies fever.  Her mother has had similar symptoms.  Patient has been taking over-the-counter Mucinex, ibuprofen, Tylenol , Sudafed with minimal improvement.  Patient denies history of asthma, COPD, smoking.   Cough   Past Medical History:  Diagnosis Date   Anxiety and depression    Migraines     Patient Active Problem List   Diagnosis Date Noted   Mild episode of recurrent major depressive disorder 09/25/2016    Past Surgical History:  Procedure Laterality Date   CHOLECYSTECTOMY      OB History   No obstetric history on file.      Home Medications    Prior to Admission medications   Medication Sig Start Date End Date Taking? Authorizing Provider  amoxicillin-clavulanate (AUGMENTIN) 875-125 MG tablet Take 1 tablet by mouth every 12 (twelve) hours. 01/02/24  Yes Christinia Lambeth, [place] E, FNP  benzonatate (TESSALON) 100 MG capsule Take 1 capsule (100 mg total) by mouth every 8 (eight) hours as needed for cough. 01/02/24  Yes Tiawanna Luchsinger, [place] E, FNP  fluconazole  (DIFLUCAN ) 150 MG tablet Take 1 tablet (150 mg total) by mouth daily. Take at first sign of vaginal yeast 01/02/24  Yes Momen Ham, [place] E, FNP  buPROPion  (WELLBUTRIN  SR) 200 MG 12 hr tablet Take 1 tablet (200 mg total) by mouth daily. 09/07/19   Geralene Kaiser, MD  desvenlafaxine (PRISTIQ) 50 MG 24 hr tablet Take 50 mg by mouth daily.    [provider]  diphenoxylate -atropine  (LOMOTIL ) 2.5-0.025 MG tablet Take 1 tablet by mouth 4 (four) times daily as needed for diarrhea or loose stools. 02/09/23   Maranda Jamee Jacob,  MD  methylphenidate 27 MG PO CR tablet Take 27 mg by mouth every morning.    [provider]  ZUMANDIMINE 3-0.03 MG tablet Take 1 tablet by mouth daily.    [provider]    Family History Family History  Problem Relation Age of Onset   Rheum arthritis Mother    Cancer Father    Anxiety disorder Sister     Social History Social History   Tobacco Use   Smoking status: Never   Smokeless tobacco: Never  Vaping Use   Vaping status: Every Day   Substances: CBD, Flavoring  Substance Use Topics   Alcohol use: Yes    Alcohol/week: 1.0 standard drink of alcohol    Types: 1 Glasses of wine per week    Comment: 1 every few days     Allergies   Patient has no known allergies.   Review of Systems Review of Systems Per HPI  Physical Exam Triage Vital Signs ED Triage Vitals  Encounter Vitals Group     BP 01/02/24 1011 113/72     Girls Systolic BP Percentile --      Girls Diastolic BP Percentile --      Boys Systolic BP Percentile --      Boys Diastolic BP Percentile --      Pulse Rate 01/02/24 1011 (!) 136     Resp 01/02/24  1011 17     Temp 01/02/24 1011 98.2 F (36.8 C)     Temp Source 01/02/24 1011 Oral     SpO2 01/02/24 1011 96 %     Weight --      Height --      Head Circumference --      Peak Flow --      Pain Score 01/02/24 1013 0     Pain Loc --      Pain Education --      Exclude from Growth Chart --    No data found.  Updated Vital Signs BP 113/72 (BP Location: Right Arm)   Pulse (!) 136   Temp 98.2 F (36.8 C) (Oral)   Resp 17   LMP 12/24/2023 (Exact Date)   SpO2 96%   Visual Acuity Right Eye Distance:   Left Eye Distance:   Bilateral Distance:    Right Eye Near:   Left Eye Near:    Bilateral Near:     Physical Exam Constitutional:      General: She is not in acute distress.    Appearance: Normal appearance. She is not toxic-appearing or diaphoretic.  HENT:     Head: Normocephalic and atraumatic.     Right Ear: Ear  canal normal. A middle ear effusion is present. Tympanic membrane is not perforated, erythematous or bulging.     Left Ear: Ear canal normal. A middle ear effusion is present. Tympanic membrane is not perforated, erythematous or bulging.     Nose: Congestion present.     Mouth/Throat:     Mouth: Mucous membranes are moist.     Pharynx: No posterior oropharyngeal erythema.  Eyes:     Extraocular Movements: Extraocular movements intact.     Conjunctiva/sclera: Conjunctivae normal.     Pupils: Pupils are equal, round, and reactive to light.  Cardiovascular:     Rate and Rhythm: Normal rate and regular rhythm.     Pulses: Normal pulses.     Heart sounds: Normal heart sounds.  Pulmonary:     Effort: Pulmonary effort is normal. No respiratory distress.     Breath sounds: Normal breath sounds. No stridor. No wheezing, rhonchi or rales.  Musculoskeletal:        General: Normal range of motion.     Cervical back: Normal range of motion.  Skin:    General: Skin is warm and dry.  Neurological:     General: No focal deficit present.     Mental Status: She is alert and oriented to person, place, and time. Mental status is at baseline.  Psychiatric:        Mood and Affect: Mood normal.        Behavior: Behavior normal.      UC Treatments / Results  Labs (all labs ordered are listed, but only abnormal results are displayed) Labs Reviewed - No data to display  EKG   Radiology No results found.  Procedures Procedures (including critical care time)  Medications Ordered in UC Medications - No data to display  Initial Impression / Assessment and Plan / UC Course  I have reviewed the triage vital signs and the nursing notes.  Pertinent labs & imaging results that were available during my care of the patient were reviewed by me and considered in my medical decision making (see chart for details).     Symptoms most likely started off as viral but given duration of symptoms, will opt  to treat with antibiotic  for secondary bacterial infection.  Augmentin prescribed for patient.  Benzonatate prescribed for cough.  There are no adventitious lung sounds on exam and oxygen is normal so do not think that chest imaging is necessary at this time.  Also recommended Flonase.  Patient reports that she has this at home.  Heart rate noted to be elevated in triage but it was normal on physical exam.  Advised strict return precautions if symptoms persist or worsen.  Patient requested Diflucan  given antibiotics typically give her yeast infection so this was prescribed to take at first sign of vaginal yeast.  Patient verbalized understanding and was agreeable with plan. Final Clinical Impressions(s) / UC Diagnoses   Final diagnoses:  Acute upper respiratory infection  Persistent cough     Discharge Instructions      I have prescribed antibiotic and a cough medication for symptoms.  Please use Flonase as well.  Follow-up if any symptoms persist or worsen.    ED Prescriptions     Medication Sig Dispense Auth. Provider   amoxicillin-clavulanate (AUGMENTIN) 875-125 MG tablet Take 1 tablet by mouth every 12 (twelve) hours. 14 tablet [place], Garry [place] E, FNP   benzonatate (TESSALON) 100 MG capsule Take 1 capsule (100 mg total) by mouth every 8 (eight) hours as needed for cough. 21 capsule [place], [place] E, FNP   fluconazole  (DIFLUCAN ) 150 MG tablet Take 1 tablet (150 mg total) by mouth daily. Take at first sign of vaginal yeast 1 tablet [place], Darryle BRAVO, [place]      PDMP not reviewed this encounter.   Hazen Darryle BRAVO, [place] 01/02/24 1121    [address] [place] 01/02/24 1123

## 2024-01-02 NOTE — ED Triage Notes (Signed)
 Pt c/o cough and chest congestion x 2 weeks. No known fever. Taking Mucinex, ibuprofen/tylenol  and Sudafed prn.

## 2024-01-02 NOTE — Discharge Instructions (Signed)
 I have prescribed antibiotic and a cough medication for symptoms.  Please use Flonase as well.  Follow-up if any symptoms persist or worsen.

## 2024-03-15 IMAGING — DX DG FINGER THUMB 2+V*L*
3 series · 3 of 3 positions shown · non-contrast
Comparison: None Available.

CLINICAL DATA: Left thumb injury, slammed in door today.

EXAM:
LEFT THUMB 2+V

[finger ap]
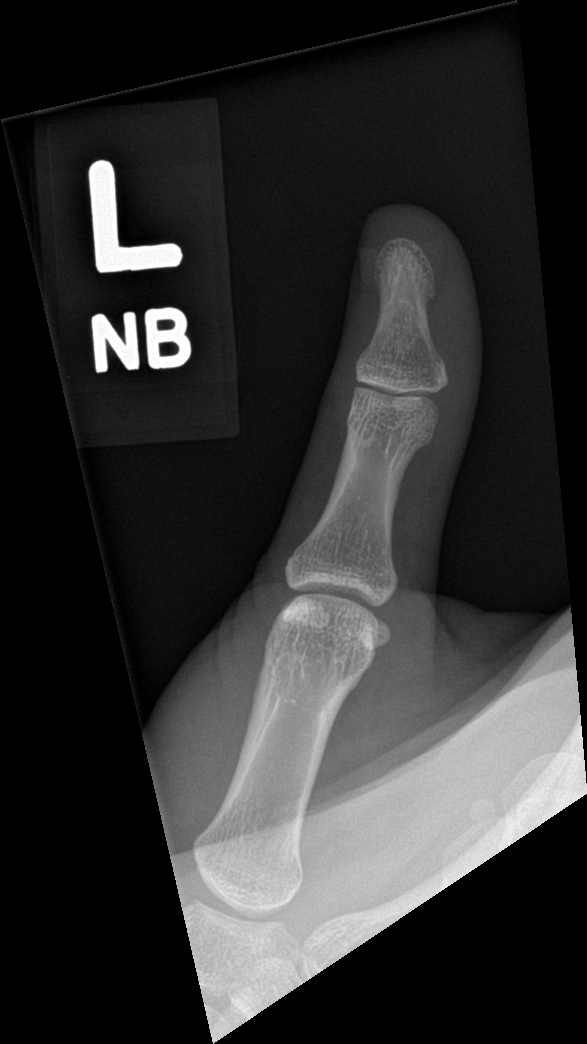

[finger obl]
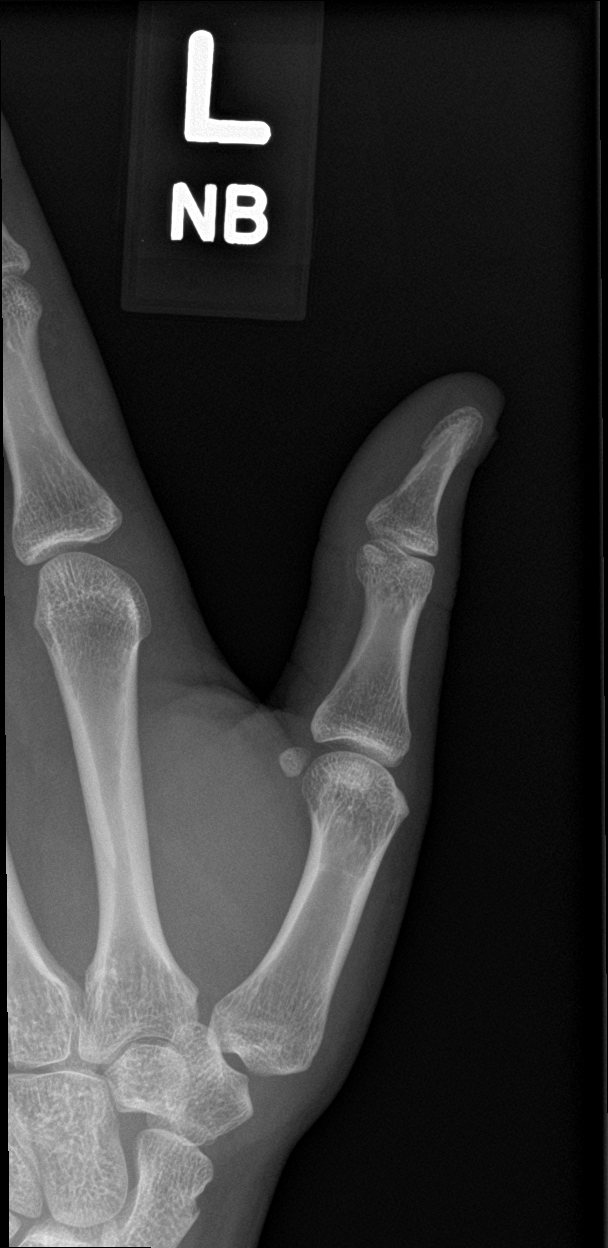

[finger lat]
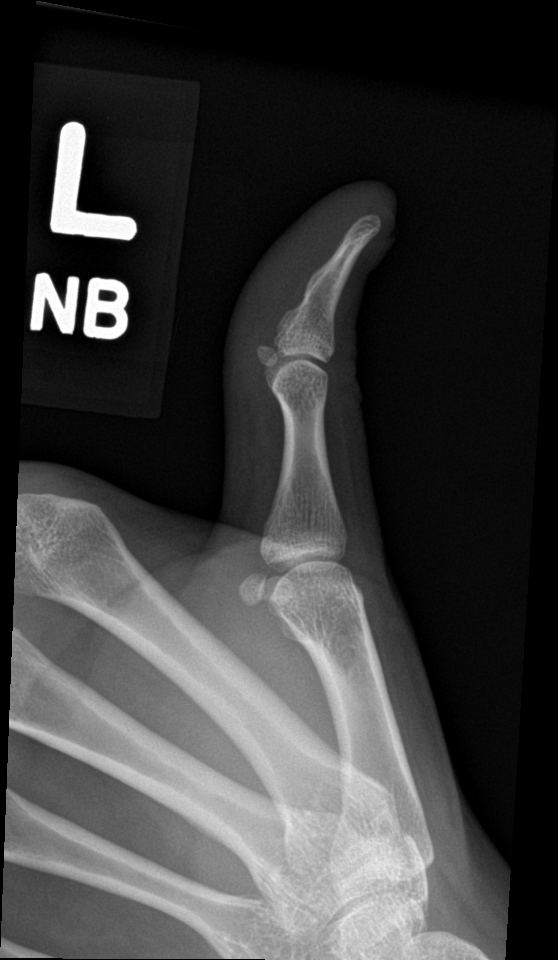

[3 of 3 positions shown; findings below may reference images not displayed]

FINDINGS: There is no evidence of fracture or dislocation. Normal alignment
and joint spaces. There is no evidence of arthropathy or other focal
bone abnormality. Soft tissues are unremarkable. No soft tissue gas
or radiopaque foreign body.
IMPRESSION: Negative radiographs of the left thumb.
